# Patient Record
Sex: Female | Born: 1955
Health system: Southern US, Community
[De-identification: ages and names within clinical notes are randomized; demographics above are authoritative.]

## PROBLEM LIST (undated history)

## (undated) DIAGNOSIS — K219 Gastro-esophageal reflux disease without esophagitis: Secondary | ICD-10-CM

## (undated) DIAGNOSIS — C50919 Malignant neoplasm of unspecified site of unspecified female breast: Secondary | ICD-10-CM

## (undated) DIAGNOSIS — G5600 Carpal tunnel syndrome, unspecified upper limb: Secondary | ICD-10-CM

## (undated) HISTORY — PX: BREAST LUMPECTOMY: SHX2

## (undated) HISTORY — DX: Malignant neoplasm of unspecified site of unspecified female breast: C50.919

## (undated) HISTORY — DX: Gastro-esophageal reflux disease without esophagitis: K21.9

## (undated) HISTORY — DX: Carpal tunnel syndrome, unspecified upper limb: G56.00

---

## 2006-02-03 ENCOUNTER — Other Ambulatory Visit: Admission: RE | Admit: 2006-02-03 | Discharge: 2006-02-03 | Payer: Self-pay | Admitting: Obstetrics and Gynecology

## 2006-03-30 ENCOUNTER — Encounter: Admission: RE | Admit: 2006-03-30 | Discharge: 2006-03-30 | Payer: Self-pay | Admitting: Obstetrics and Gynecology

## 2006-03-30 ENCOUNTER — Encounter (INDEPENDENT_AMBULATORY_CARE_PROVIDER_SITE_OTHER): Payer: Self-pay | Admitting: Diagnostic Radiology

## 2006-03-30 ENCOUNTER — Encounter (INDEPENDENT_AMBULATORY_CARE_PROVIDER_SITE_OTHER): Payer: Self-pay | Admitting: *Deleted

## 2006-04-21 ENCOUNTER — Encounter: Admission: RE | Admit: 2006-04-21 | Discharge: 2006-04-21 | Payer: Self-pay | Admitting: General Surgery

## 2006-07-07 ENCOUNTER — Encounter: Admission: RE | Admit: 2006-07-07 | Discharge: 2006-07-07 | Payer: Self-pay | Admitting: General Surgery

## 2006-07-09 ENCOUNTER — Encounter: Admission: RE | Admit: 2006-07-09 | Discharge: 2006-07-09 | Payer: Self-pay | Admitting: General Surgery

## 2006-07-09 ENCOUNTER — Ambulatory Visit (HOSPITAL_BASED_OUTPATIENT_CLINIC_OR_DEPARTMENT_OTHER): Admission: RE | Admit: 2006-07-09 | Discharge: 2006-07-09 | Payer: Self-pay | Admitting: General Surgery

## 2006-07-09 ENCOUNTER — Encounter (INDEPENDENT_AMBULATORY_CARE_PROVIDER_SITE_OTHER): Payer: Self-pay | Admitting: Specialist

## 2006-07-10 ENCOUNTER — Ambulatory Visit: Payer: Self-pay | Admitting: Oncology

## 2006-07-15 LAB — CBC WITH DIFFERENTIAL/PLATELET
BASO%: 0.4 % (ref 0.0–2.0)
EOS%: 3 % (ref 0.0–7.0)
HCT: 33.8 % — ABNORMAL LOW (ref 34.8–46.6)
LYMPH%: 31.5 % (ref 14.0–48.0)
MCH: 23.9 pg — ABNORMAL LOW (ref 26.0–34.0)
MCHC: 32.2 g/dL (ref 32.0–36.0)
MONO%: 8.3 % (ref 0.0–13.0)
NEUT%: 56.8 % (ref 39.6–76.8)
Platelets: 238 10*3/uL (ref 145–400)
RBC: 4.55 10*6/uL (ref 3.70–5.32)
WBC: 3.6 10*3/uL — ABNORMAL LOW (ref 3.9–10.0)

## 2006-07-15 LAB — COMPREHENSIVE METABOLIC PANEL
ALT: 13 U/L (ref 0–40)
AST: 17 U/L (ref 0–37)
Alkaline Phosphatase: 100 U/L (ref 39–117)
CO2: 25 mEq/L (ref 19–32)
Creatinine, Ser: 0.93 mg/dL (ref 0.40–1.20)
Sodium: 139 mEq/L (ref 135–145)
Total Bilirubin: 0.4 mg/dL (ref 0.3–1.2)
Total Protein: 7.6 g/dL (ref 6.0–8.3)

## 2006-07-15 LAB — IRON AND TIBC
%SAT: 9 % — ABNORMAL LOW (ref 20–55)
TIBC: 455 ug/dL (ref 250–470)
UIBC: 416 ug/dL

## 2006-07-15 LAB — CANCER ANTIGEN 27.29: CA 27.29: 20 U/mL (ref 0–39)

## 2006-07-23 ENCOUNTER — Ambulatory Visit (HOSPITAL_COMMUNITY): Admission: RE | Admit: 2006-07-23 | Discharge: 2006-07-23 | Payer: Self-pay | Admitting: Oncology

## 2008-04-17 ENCOUNTER — Encounter: Payer: Self-pay | Admitting: Internal Medicine

## 2008-04-18 ENCOUNTER — Encounter: Admission: RE | Admit: 2008-04-18 | Discharge: 2008-04-18 | Payer: Self-pay | Admitting: Obstetrics and Gynecology

## 2008-05-01 ENCOUNTER — Ambulatory Visit: Payer: Self-pay | Admitting: Internal Medicine

## 2008-05-01 DIAGNOSIS — R195 Other fecal abnormalities: Secondary | ICD-10-CM | POA: Insufficient documentation

## 2008-05-01 DIAGNOSIS — K5900 Constipation, unspecified: Secondary | ICD-10-CM | POA: Insufficient documentation

## 2008-05-01 DIAGNOSIS — C50919 Malignant neoplasm of unspecified site of unspecified female breast: Secondary | ICD-10-CM

## 2008-05-10 ENCOUNTER — Encounter: Payer: Self-pay | Admitting: Internal Medicine

## 2008-05-10 ENCOUNTER — Ambulatory Visit: Payer: Self-pay | Admitting: Internal Medicine

## 2008-05-12 ENCOUNTER — Encounter: Payer: Self-pay | Admitting: Internal Medicine

## 2008-05-22 ENCOUNTER — Encounter (INDEPENDENT_AMBULATORY_CARE_PROVIDER_SITE_OTHER): Payer: Self-pay | Admitting: Surgery

## 2008-05-22 ENCOUNTER — Ambulatory Visit (HOSPITAL_BASED_OUTPATIENT_CLINIC_OR_DEPARTMENT_OTHER): Admission: RE | Admit: 2008-05-22 | Discharge: 2008-05-22 | Payer: Self-pay | Admitting: Surgery

## 2008-05-22 ENCOUNTER — Encounter: Admission: RE | Admit: 2008-05-22 | Discharge: 2008-05-22 | Payer: Self-pay | Admitting: Surgery

## 2008-05-31 ENCOUNTER — Ambulatory Visit: Admission: RE | Admit: 2008-05-31 | Discharge: 2008-08-29 | Payer: Self-pay | Admitting: Radiation Oncology

## 2009-03-15 ENCOUNTER — Ambulatory Visit: Payer: Self-pay | Admitting: Internal Medicine

## 2009-03-15 DIAGNOSIS — J018 Other acute sinusitis: Secondary | ICD-10-CM | POA: Insufficient documentation

## 2009-03-19 ENCOUNTER — Ambulatory Visit: Payer: Self-pay | Admitting: Hematology & Oncology

## 2009-03-22 ENCOUNTER — Ambulatory Visit: Payer: Self-pay | Admitting: Internal Medicine

## 2009-03-22 LAB — CONVERTED CEMR LAB
ALT: 26 units/L (ref 0–35)
Alkaline Phosphatase: 158 units/L — ABNORMAL HIGH (ref 39–117)
BUN: 11 mg/dL (ref 6–23)
Bilirubin, Direct: 0 mg/dL (ref 0.0–0.3)
Calcium: 9.8 mg/dL (ref 8.4–10.5)
Chloride: 102 meq/L (ref 96–112)
Cholesterol: 181 mg/dL (ref 0–200)
Creatinine, Ser: 0.8 mg/dL (ref 0.4–1.2)
Eosinophils Relative: 3.2 % (ref 0.0–5.0)
GFR calc non Af Amer: 96.51 mL/min (ref >60)
HDL: 79.7 mg/dL (ref >39.00)
LDL Cholesterol: 87 mg/dL (ref 0–99)
Lymphocytes Relative: 38 % (ref 12.0–46.0)
MCV: 87.2 fL (ref 78.0–100.0)
Monocytes Absolute: 0.6 10*3/uL (ref 0.1–1.0)
Neutrophils Relative %: 44.8 % (ref 43.0–77.0)
Platelets: 178 10*3/uL (ref 150.0–400.0)
Total Bilirubin: 0.8 mg/dL (ref 0.3–1.2)
Total CHOL/HDL Ratio: 2
Triglycerides: 73 mg/dL (ref 0.0–149.0)
VLDL: 14.6 mg/dL (ref 0.0–40.0)
WBC: 4.5 10*3/uL (ref 4.5–10.5)

## 2009-04-06 ENCOUNTER — Encounter: Payer: Self-pay | Admitting: Internal Medicine

## 2009-04-19 ENCOUNTER — Encounter: Admission: RE | Admit: 2009-04-19 | Discharge: 2009-04-19 | Payer: Self-pay | Admitting: Internal Medicine

## 2009-05-15 ENCOUNTER — Ambulatory Visit: Payer: Self-pay | Admitting: Hematology & Oncology

## 2009-05-16 ENCOUNTER — Encounter: Payer: Self-pay | Admitting: Internal Medicine

## 2009-05-16 LAB — CBC WITH DIFFERENTIAL (CANCER CENTER ONLY)
BASO%: 0.4 % (ref 0.0–2.0)
EOS%: 5.6 % (ref 0.0–7.0)
HCT: 38.1 % (ref 34.8–46.6)
LYMPH%: 42.7 % (ref 14.0–48.0)
MCH: 28.6 pg (ref 26.0–34.0)
MCHC: 33.9 g/dL (ref 32.0–36.0)
MCV: 84 fL (ref 81–101)
MONO#: 0.2 10*3/uL (ref 0.1–0.9)
MONO%: 5.8 % (ref 0.0–13.0)
NEUT%: 45.5 % (ref 39.6–80.0)
Platelets: 196 10*3/uL (ref 145–400)
RDW: 12 % (ref 10.5–14.6)
WBC: 3.1 10*3/uL — ABNORMAL LOW (ref 3.9–10.0)

## 2009-05-16 LAB — COMPREHENSIVE METABOLIC PANEL
ALT: 16 U/L (ref 0–35)
AST: 16 U/L (ref 0–37)
Albumin: 4.3 g/dL (ref 3.5–5.2)
Alkaline Phosphatase: 143 U/L — ABNORMAL HIGH (ref 39–117)
Calcium: 9.5 mg/dL (ref 8.4–10.5)
Chloride: 104 mEq/L (ref 96–112)
Potassium: 4 mEq/L (ref 3.5–5.3)
Sodium: 141 mEq/L (ref 135–145)

## 2009-09-13 ENCOUNTER — Ambulatory Visit (HOSPITAL_BASED_OUTPATIENT_CLINIC_OR_DEPARTMENT_OTHER): Admission: RE | Admit: 2009-09-13 | Discharge: 2009-09-13 | Payer: Self-pay | Admitting: Internal Medicine

## 2009-09-13 ENCOUNTER — Telehealth: Payer: Self-pay | Admitting: Internal Medicine

## 2009-09-13 ENCOUNTER — Ambulatory Visit: Payer: Self-pay | Admitting: Diagnostic Radiology

## 2009-09-13 ENCOUNTER — Ambulatory Visit: Payer: Self-pay | Admitting: Internal Medicine

## 2009-09-13 DIAGNOSIS — R079 Chest pain, unspecified: Secondary | ICD-10-CM

## 2009-09-13 DIAGNOSIS — G5600 Carpal tunnel syndrome, unspecified upper limb: Secondary | ICD-10-CM | POA: Insufficient documentation

## 2009-09-20 ENCOUNTER — Telehealth: Payer: Self-pay | Admitting: Internal Medicine

## 2009-10-02 ENCOUNTER — Telehealth: Payer: Self-pay | Admitting: Internal Medicine

## 2009-10-02 ENCOUNTER — Ambulatory Visit (HOSPITAL_COMMUNITY): Admission: RE | Admit: 2009-10-02 | Discharge: 2009-10-02 | Payer: Self-pay | Admitting: Internal Medicine

## 2009-11-06 ENCOUNTER — Ambulatory Visit: Payer: Self-pay | Admitting: Hematology & Oncology

## 2009-11-07 ENCOUNTER — Encounter: Payer: Self-pay | Admitting: Internal Medicine

## 2009-11-07 LAB — CANCER ANTIGEN 27.29: CA 27.29: 12 U/mL (ref 0–39)

## 2009-11-07 LAB — CBC WITH DIFFERENTIAL (CANCER CENTER ONLY)
Eosinophils Absolute: 0.2 10*3/uL (ref 0.0–0.5)
HCT: 38 % (ref 34.8–46.6)
LYMPH%: 38.8 % (ref 14.0–48.0)
MCH: 28.6 pg (ref 26.0–34.0)
MCV: 83 fL (ref 81–101)
MONO#: 0.2 10*3/uL (ref 0.1–0.9)
MONO%: 5.5 % (ref 0.0–13.0)
NEUT%: 49.7 % (ref 39.6–80.0)
Platelets: 178 10*3/uL (ref 145–400)
RBC: 4.59 10*6/uL (ref 3.70–5.32)

## 2009-11-08 LAB — VITAMIN D 25 HYDROXY (VIT D DEFICIENCY, FRACTURES): Vit D, 25-Hydroxy: 26 ng/mL — ABNORMAL LOW (ref 30–89)

## 2009-11-08 LAB — COMPREHENSIVE METABOLIC PANEL
ALT: 25 U/L (ref 0–35)
BUN: 12 mg/dL (ref 6–23)
CO2: 22 mEq/L (ref 19–32)
Calcium: 9.7 mg/dL (ref 8.4–10.5)
Chloride: 103 mEq/L (ref 96–112)
Creatinine, Ser: 0.9 mg/dL (ref 0.40–1.20)
Glucose, Bld: 87 mg/dL (ref 70–99)
Total Bilirubin: 0.5 mg/dL (ref 0.3–1.2)

## 2009-11-22 ENCOUNTER — Ambulatory Visit: Payer: Self-pay | Admitting: Diagnostic Radiology

## 2009-11-22 ENCOUNTER — Ambulatory Visit (HOSPITAL_BASED_OUTPATIENT_CLINIC_OR_DEPARTMENT_OTHER): Admission: RE | Admit: 2009-11-22 | Discharge: 2009-11-22 | Payer: Self-pay | Admitting: Hematology & Oncology

## 2009-12-05 ENCOUNTER — Encounter: Admission: RE | Admit: 2009-12-05 | Discharge: 2009-12-05 | Payer: Self-pay | Admitting: Internal Medicine

## 2009-12-20 ENCOUNTER — Ambulatory Visit: Payer: Self-pay | Admitting: Hematology & Oncology

## 2009-12-24 ENCOUNTER — Encounter: Payer: Self-pay | Admitting: Internal Medicine

## 2009-12-24 LAB — CBC WITH DIFFERENTIAL (CANCER CENTER ONLY)
Eosinophils Absolute: 0.1 10*3/uL (ref 0.0–0.5)
HCT: 38.6 % (ref 34.8–46.6)
LYMPH%: 42.4 % (ref 14.0–48.0)
MCV: 85 fL (ref 81–101)
MONO#: 0.2 10*3/uL (ref 0.1–0.9)
NEUT%: 46.4 % (ref 39.6–80.0)
RBC: 4.57 10*6/uL (ref 3.70–5.32)
RDW: 12 % (ref 10.5–14.6)
WBC: 3.6 10*3/uL — ABNORMAL LOW (ref 3.9–10.0)

## 2009-12-25 LAB — COMPREHENSIVE METABOLIC PANEL
CO2: 21 mEq/L (ref 19–32)
Creatinine, Ser: 0.92 mg/dL (ref 0.40–1.20)
Glucose, Bld: 101 mg/dL — ABNORMAL HIGH (ref 70–99)
Total Bilirubin: 0.3 mg/dL (ref 0.3–1.2)

## 2009-12-25 LAB — VITAMIN D 25 HYDROXY (VIT D DEFICIENCY, FRACTURES): Vit D, 25-Hydroxy: 23 ng/mL — ABNORMAL LOW (ref 30–89)

## 2009-12-26 ENCOUNTER — Ambulatory Visit (HOSPITAL_BASED_OUTPATIENT_CLINIC_OR_DEPARTMENT_OTHER): Admission: RE | Admit: 2009-12-26 | Discharge: 2009-12-26 | Payer: Self-pay | Admitting: Hematology & Oncology

## 2009-12-26 ENCOUNTER — Ambulatory Visit: Payer: Self-pay | Admitting: Diagnostic Radiology

## 2010-01-29 ENCOUNTER — Ambulatory Visit: Payer: Self-pay | Admitting: Hematology & Oncology

## 2010-01-30 ENCOUNTER — Encounter: Payer: Self-pay | Admitting: Internal Medicine

## 2010-01-30 LAB — COMPREHENSIVE METABOLIC PANEL
ALT: 27 U/L (ref 0–35)
BUN: 13 mg/dL (ref 6–23)
CO2: 21 mEq/L (ref 19–32)
Calcium: 9.3 mg/dL (ref 8.4–10.5)
Chloride: 104 mEq/L (ref 96–112)
Creatinine, Ser: 0.86 mg/dL (ref 0.40–1.20)
Glucose, Bld: 83 mg/dL (ref 70–99)
Total Bilirubin: 0.3 mg/dL (ref 0.3–1.2)

## 2010-01-30 LAB — CBC WITH DIFFERENTIAL (CANCER CENTER ONLY)
BASO#: 0 10*3/uL (ref 0.0–0.2)
BASO%: 0.5 % (ref 0.0–2.0)
EOS%: 3.2 % (ref 0.0–7.0)
HCT: 39.7 % (ref 34.8–46.6)
HGB: 13.1 g/dL (ref 11.6–15.9)
LYMPH#: 1.9 10*3/uL (ref 0.9–3.3)
MCHC: 33 g/dL (ref 32.0–36.0)
MONO#: 0.4 10*3/uL (ref 0.1–0.9)
NEUT#: 2.7 10*3/uL (ref 1.5–6.5)
WBC: 5.1 10*3/uL (ref 3.9–10.0)

## 2010-01-30 LAB — VITAMIN D 25 HYDROXY (VIT D DEFICIENCY, FRACTURES): Vit D, 25-Hydroxy: 28 ng/mL — ABNORMAL LOW (ref 30–89)

## 2010-01-30 LAB — CANCER ANTIGEN 27.29: CA 27.29: 4 U/mL (ref 0–39)

## 2010-02-27 LAB — COMPREHENSIVE METABOLIC PANEL
ALT: 29 U/L (ref 0–35)
AST: 28 U/L (ref 0–37)
Albumin: 4.4 g/dL (ref 3.5–5.2)
Alkaline Phosphatase: 113 U/L (ref 39–117)
Glucose, Bld: 107 mg/dL — ABNORMAL HIGH (ref 70–99)
Potassium: 4.2 mEq/L (ref 3.5–5.3)
Sodium: 139 mEq/L (ref 135–145)
Total Bilirubin: 0.4 mg/dL (ref 0.3–1.2)
Total Protein: 7.5 g/dL (ref 6.0–8.3)

## 2010-03-27 ENCOUNTER — Ambulatory Visit: Payer: Self-pay | Admitting: Hematology & Oncology

## 2010-03-29 LAB — COMPREHENSIVE METABOLIC PANEL
Albumin: 4.3 g/dL (ref 3.5–5.2)
Alkaline Phosphatase: 104 U/L (ref 39–117)
BUN: 12 mg/dL (ref 6–23)
CO2: 21 mEq/L (ref 19–32)
Calcium: 9.3 mg/dL (ref 8.4–10.5)
Glucose, Bld: 101 mg/dL — ABNORMAL HIGH (ref 70–99)
Potassium: 4 mEq/L (ref 3.5–5.3)
Sodium: 139 mEq/L (ref 135–145)
Total Protein: 7.5 g/dL (ref 6.0–8.3)

## 2010-03-29 LAB — CANCER ANTIGEN 27.29: CA 27.29: 15 U/mL (ref 0–39)

## 2010-04-18 ENCOUNTER — Encounter: Payer: Self-pay | Admitting: Internal Medicine

## 2010-04-30 ENCOUNTER — Ambulatory Visit (HOSPITAL_COMMUNITY): Admission: RE | Admit: 2010-04-30 | Discharge: 2010-04-30 | Payer: Self-pay | Admitting: Hematology & Oncology

## 2010-05-21 ENCOUNTER — Ambulatory Visit: Payer: Self-pay | Admitting: Hematology & Oncology

## 2010-05-23 ENCOUNTER — Encounter: Payer: Self-pay | Admitting: Internal Medicine

## 2010-05-23 LAB — CBC WITH DIFFERENTIAL (CANCER CENTER ONLY)
BASO%: 0.5 % (ref 0.0–2.0)
HCT: 36.8 % (ref 34.8–46.6)
LYMPH%: 36.4 % (ref 14.0–48.0)
MCH: 28.7 pg (ref 26.0–34.0)
MCV: 84 fL (ref 81–101)
MONO%: 5.7 % (ref 0.0–13.0)
NEUT%: 51.9 % (ref 39.6–80.0)
Platelets: 182 10*3/uL (ref 145–400)
RDW: 12.3 % (ref 10.5–14.6)

## 2010-05-23 LAB — COMPREHENSIVE METABOLIC PANEL
ALT: 19 U/L (ref 0–35)
Alkaline Phosphatase: 106 U/L (ref 39–117)
Creatinine, Ser: 0.9 mg/dL (ref 0.40–1.20)
Glucose, Bld: 104 mg/dL — ABNORMAL HIGH (ref 70–99)
Sodium: 138 mEq/L (ref 135–145)
Total Bilirubin: 0.4 mg/dL (ref 0.3–1.2)
Total Protein: 7.5 g/dL (ref 6.0–8.3)

## 2010-06-26 ENCOUNTER — Ambulatory Visit: Payer: Self-pay | Admitting: Hematology & Oncology

## 2010-06-27 LAB — BASIC METABOLIC PANEL
BUN: 13 mg/dL (ref 6–23)
CO2: 22 mEq/L (ref 19–32)
Chloride: 101 mEq/L (ref 96–112)
Glucose, Bld: 115 mg/dL — ABNORMAL HIGH (ref 70–99)
Potassium: 4 mEq/L (ref 3.5–5.3)
Sodium: 139 mEq/L (ref 135–145)

## 2010-07-25 ENCOUNTER — Encounter: Payer: Self-pay | Admitting: Internal Medicine

## 2010-07-25 LAB — CBC WITH DIFFERENTIAL (CANCER CENTER ONLY)
BASO%: 0.8 % (ref 0.0–2.0)
EOS%: 4.2 % (ref 0.0–7.0)
HCT: 38.2 % (ref 34.8–46.6)
LYMPH#: 2 10*3/uL (ref 0.9–3.3)
LYMPH%: 35.9 % (ref 14.0–48.0)
MCH: 28.3 pg (ref 26.0–34.0)
MCHC: 33.3 g/dL (ref 32.0–36.0)
MONO%: 6.5 % (ref 0.0–13.0)
NEUT%: 52.6 % (ref 39.6–80.0)
RDW: 12.3 % (ref 10.5–14.6)

## 2010-07-25 LAB — COMPREHENSIVE METABOLIC PANEL
ALT: 87 U/L — ABNORMAL HIGH (ref 0–35)
AST: 58 U/L — ABNORMAL HIGH (ref 0–37)
Alkaline Phosphatase: 142 U/L — ABNORMAL HIGH (ref 39–117)
Chloride: 106 mEq/L (ref 96–112)
Creatinine, Ser: 0.81 mg/dL (ref 0.40–1.20)
Total Bilirubin: 0.4 mg/dL (ref 0.3–1.2)

## 2010-07-31 ENCOUNTER — Encounter: Admission: RE | Admit: 2010-07-31 | Discharge: 2010-07-31 | Payer: Self-pay | Admitting: Hematology & Oncology

## 2010-08-05 ENCOUNTER — Ambulatory Visit (HOSPITAL_COMMUNITY): Admission: RE | Admit: 2010-08-05 | Discharge: 2010-08-05 | Payer: Self-pay | Admitting: Hematology & Oncology

## 2010-08-20 ENCOUNTER — Ambulatory Visit: Payer: Self-pay | Admitting: Hematology & Oncology

## 2010-08-22 ENCOUNTER — Ambulatory Visit: Payer: Self-pay | Admitting: Oncology

## 2010-08-22 LAB — BASIC METABOLIC PANEL
BUN: 8 mg/dL (ref 6–23)
Chloride: 107 mEq/L (ref 96–112)
Glucose, Bld: 119 mg/dL — ABNORMAL HIGH (ref 70–99)
Potassium: 3.9 mEq/L (ref 3.5–5.3)

## 2010-09-19 ENCOUNTER — Ambulatory Visit: Payer: Self-pay | Admitting: Hematology & Oncology

## 2010-09-20 ENCOUNTER — Encounter: Payer: Self-pay | Admitting: Internal Medicine

## 2010-09-20 LAB — BASIC METABOLIC PANEL
BUN: 10 mg/dL (ref 6–23)
Glucose, Bld: 85 mg/dL (ref 70–99)
Potassium: 4.1 mEq/L (ref 3.5–5.3)

## 2010-09-20 LAB — CANCER ANTIGEN 27.29: CA 27.29: 15 U/mL (ref 0–39)

## 2010-10-18 LAB — VITAMIN D 25 HYDROXY (VIT D DEFICIENCY, FRACTURES): Vit D, 25-Hydroxy: 23 ng/mL — ABNORMAL LOW (ref 30–89)

## 2010-10-18 LAB — BASIC METABOLIC PANEL
Chloride: 101 mEq/L (ref 96–112)
Potassium: 3.8 mEq/L (ref 3.5–5.3)

## 2010-11-12 ENCOUNTER — Ambulatory Visit: Payer: Self-pay | Admitting: Hematology & Oncology

## 2010-11-13 LAB — BASIC METABOLIC PANEL
BUN: 8 mg/dL (ref 6–23)
CO2: 24 mEq/L (ref 19–32)
Glucose, Bld: 92 mg/dL (ref 70–99)
Potassium: 3.7 mEq/L (ref 3.5–5.3)
Sodium: 141 mEq/L (ref 135–145)

## 2010-12-12 ENCOUNTER — Ambulatory Visit: Payer: Self-pay | Admitting: Hematology & Oncology

## 2010-12-12 ENCOUNTER — Encounter: Payer: Self-pay | Admitting: Internal Medicine

## 2010-12-12 LAB — BASIC METABOLIC PANEL
Chloride: 104 mEq/L (ref 96–112)
Glucose, Bld: 83 mg/dL (ref 70–99)
Potassium: 3.8 mEq/L (ref 3.5–5.3)
Sodium: 139 mEq/L (ref 135–145)

## 2011-01-12 ENCOUNTER — Encounter: Payer: Self-pay | Admitting: Obstetrics and Gynecology

## 2011-01-12 ENCOUNTER — Encounter: Payer: Self-pay | Admitting: Internal Medicine

## 2011-01-12 ENCOUNTER — Encounter: Payer: Self-pay | Admitting: General Surgery

## 2011-01-13 ENCOUNTER — Encounter: Payer: Self-pay | Admitting: Radiation Oncology

## 2011-01-21 ENCOUNTER — Ambulatory Visit: Payer: Self-pay | Admitting: Oncology

## 2011-01-21 NOTE — Letter (Signed)
[facility]   Imported By: Lanelle Bal 01/21/2010 13:07:37  _____________________________________________________________________  External Attachment:    Type:   Image     Comment:   External Document

## 2011-01-21 NOTE — Letter (Signed)
Summary: [facility]  [facility]   Imported By: Maryln Gottron 10/18/2010 13:31:56  _____________________________________________________________________  External Attachment:    Type:   Image     Comment:   External Document

## 2011-01-21 NOTE — Letter (Signed)
[facility]   Imported By: Lanelle Bal 06/27/2010 12:08:54  _____________________________________________________________________  External Attachment:    Type:   Image     Comment:   External Document

## 2011-01-21 NOTE — Letter (Signed)
[facility]   Imported By: Lanelle Bal 05/13/2010 11:13:23  _____________________________________________________________________  External Attachment:    Type:   Image     Comment:   External Document

## 2011-01-21 NOTE — Letter (Signed)
[facility]   Imported By: Lanelle Bal 02/19/2010 11:59:38  _____________________________________________________________________  External Attachment:    Type:   Image     Comment:   External Document

## 2011-01-21 NOTE — Letter (Signed)
[facility]   Imported By: Lanelle Bal 08/14/2010 09:32:02  _____________________________________________________________________  External Attachment:    Type:   Image     Comment:   External Document

## 2011-01-23 NOTE — Letter (Signed)
Summary: [facility]  [facility]   Imported By: Lanelle Bal 01/07/2011 09:40:22  _____________________________________________________________________  External Attachment:    Type:   Image     Comment:   External Document

## 2011-03-21 ENCOUNTER — Other Ambulatory Visit: Payer: Self-pay | Admitting: Hematology & Oncology

## 2011-03-21 ENCOUNTER — Encounter (HOSPITAL_BASED_OUTPATIENT_CLINIC_OR_DEPARTMENT_OTHER): Payer: Managed Care, Other (non HMO) | Admitting: Hematology & Oncology

## 2011-03-21 DIAGNOSIS — M549 Dorsalgia, unspecified: Secondary | ICD-10-CM

## 2011-03-21 DIAGNOSIS — C50519 Malignant neoplasm of lower-outer quadrant of unspecified female breast: Secondary | ICD-10-CM

## 2011-03-21 DIAGNOSIS — C7952 Secondary malignant neoplasm of bone marrow: Secondary | ICD-10-CM

## 2011-03-21 DIAGNOSIS — C50919 Malignant neoplasm of unspecified site of unspecified female breast: Secondary | ICD-10-CM

## 2011-03-21 DIAGNOSIS — C7951 Secondary malignant neoplasm of bone: Secondary | ICD-10-CM

## 2011-03-21 LAB — CBC WITH DIFFERENTIAL (CANCER CENTER ONLY)
BASO#: 0 10*3/uL (ref 0.0–0.2)
EOS%: 4.3 % (ref 0.0–7.0)
HCT: 36.3 % (ref 34.8–46.6)
HGB: 12.3 g/dL (ref 11.6–15.9)
LYMPH%: 39.2 % (ref 14.0–48.0)
MCH: 28.3 pg (ref 26.0–34.0)
MCHC: 33.9 g/dL (ref 32.0–36.0)
MCV: 83 fL (ref 81–101)
MONO%: 8.1 % (ref 0.0–13.0)
NEUT%: 48.4 % (ref 39.6–80.0)

## 2011-03-21 LAB — CMP (CANCER CENTER ONLY)
AST: 22 U/L (ref 11–38)
Alkaline Phosphatase: 109 U/L — ABNORMAL HIGH (ref 26–84)
BUN, Bld: 11 mg/dL (ref 7–22)
Calcium: 8.9 mg/dL (ref 8.0–10.3)
Chloride: 100 mEq/L (ref 98–108)
Creat: 0.8 mg/dl (ref 0.6–1.2)
Total Bilirubin: 0.7 mg/dl (ref 0.20–1.60)

## 2011-03-21 LAB — CANCER ANTIGEN 27.29: CA 27.29: 13 U/mL (ref 0–39)

## 2011-04-09 ENCOUNTER — Encounter (HOSPITAL_COMMUNITY)
Admission: RE | Admit: 2011-04-09 | Discharge: 2011-04-09 | Disposition: A | Discharge status: Home / Self Care | Payer: Managed Care, Other (non HMO) | Source: Ambulatory Visit | Attending: Hematology & Oncology | Admitting: Hematology & Oncology

## 2011-04-09 ENCOUNTER — Encounter (HOSPITAL_COMMUNITY): Payer: Self-pay

## 2011-04-09 DIAGNOSIS — C50919 Malignant neoplasm of unspecified site of unspecified female breast: Secondary | ICD-10-CM | POA: Insufficient documentation

## 2011-04-09 DIAGNOSIS — C7951 Secondary malignant neoplasm of bone: Secondary | ICD-10-CM | POA: Insufficient documentation

## 2011-04-09 LAB — GLUCOSE, CAPILLARY: Glucose-Capillary: 100 mg/dL — ABNORMAL HIGH (ref 70–99)

## 2011-04-09 MED ORDER — FLUDEOXYGLUCOSE F - 18 (FDG) INJECTION
17.2000 | Freq: Once | INTRAVENOUS | Status: AC | PRN
  Administered 2011-04-09: 17.2 via INTRAVENOUS

## 2011-04-21 ENCOUNTER — Other Ambulatory Visit: Payer: Self-pay | Admitting: Hematology & Oncology

## 2011-04-21 ENCOUNTER — Encounter (HOSPITAL_BASED_OUTPATIENT_CLINIC_OR_DEPARTMENT_OTHER): Payer: Managed Care, Other (non HMO) | Admitting: Hematology & Oncology

## 2011-04-21 DIAGNOSIS — C50519 Malignant neoplasm of lower-outer quadrant of unspecified female breast: Secondary | ICD-10-CM

## 2011-04-21 DIAGNOSIS — C7951 Secondary malignant neoplasm of bone: Secondary | ICD-10-CM

## 2011-04-21 LAB — CBC WITH DIFFERENTIAL (CANCER CENTER ONLY)
BASO%: 0.2 % (ref 0.0–2.0)
Eosinophils Absolute: 0.2 10*3/uL (ref 0.0–0.5)
LYMPH#: 1.8 10*3/uL (ref 0.9–3.3)
LYMPH%: 37.8 % (ref 14.0–48.0)
MCV: 84 fL (ref 81–101)
MONO#: 0.4 10*3/uL (ref 0.1–0.9)
NEUT#: 2.3 10*3/uL (ref 1.5–6.5)
Platelets: 166 10*3/uL (ref 145–400)
RBC: 4.57 10*6/uL (ref 3.70–5.32)
RDW: 12.4 % (ref 11.1–15.7)
WBC: 4.7 10*3/uL (ref 3.9–10.0)

## 2011-04-21 LAB — CMP (CANCER CENTER ONLY)
ALT(SGPT): 27 U/L (ref 10–47)
Albumin: 3.5 g/dL (ref 3.3–5.5)
CO2: 22 mEq/L (ref 18–33)
Calcium: 9.1 mg/dL (ref 8.0–10.3)
Chloride: 109 mEq/L — ABNORMAL HIGH (ref 98–108)
Glucose, Bld: 134 mg/dL — ABNORMAL HIGH (ref 73–118)
Potassium: 4.1 mEq/L (ref 3.3–4.7)
Sodium: 143 mEq/L (ref 128–145)
Total Protein: 7.8 g/dL (ref 6.4–8.1)

## 2011-04-22 LAB — VITAMIN D 25 HYDROXY (VIT D DEFICIENCY, FRACTURES): Vit D, 25-Hydroxy: 19 ng/mL — ABNORMAL LOW (ref 30–89)

## 2011-05-06 NOTE — Op Note (Signed)
Nancy Herring, Nancy Herring             ACCOUNT NO.:  [id number]   MEDICAL RECORD NO.:  [id number]          PATIENT TYPE:  AMB   LOCATION:  DSC                          FACILITY:  [facility]   PHYSICIAN:  Thomas A. Cornett, M.D.DATE OF BIRTH:  July 14, 1956   DATE OF PROCEDURE:  05/22/2008  DATE OF DISCHARGE:                               OPERATIVE REPORT   PREOPERATIVE DIAGNOSIS:  Left breast microcalcifications.   POSTOPERATIVE DIAGNOSIS:  Left breast microcalcifications.   PROCEDURE:  Left breast needle-localized excisional biopsy.   SURGEON:  Maisie Fus A. Cornett, MD   ASSISTANT:  OR staff.   ANESTHESIA:  LMA with 0.25% Sensorcaine local.   ESTIMATED BLOOD LOSS:  10 mL.   SPECIMEN:  Left breast tissue with localizing wire.  Microcalcifications  were noted on the radiograph.   INDICATIONS FOR PROCEDURE:  The patient is a 55 year old female with  calcifications in the deep left breast.  These were deemed suspicious,  but not accessible by stereotactic core techniques.  She presents today  for left breast needle-localized lumpectomy.   DESCRIPTION OF PROCEDURE:  The patient was brought to the operating room  after undergoing left breast wire localization.  After induction of LMA  anesthesia, the left breast was prepped and draped in sterile fashion.  A 0.25% Sensorcaine was infiltrated around the area.  An old scar was  used from a previous breast surgery.  We dissected down around the tip  of the wire and excised all tissue.  Radiograph was obtained in the  operating room which showed calcifications in the wire that was intact.  This was sent to pathology for further evaluation.  We then irrigated  out the wound and closed it using 4-0 Monocryl.  Dermabond was applied.  All final counts of sponge, needle, and instruments were found to be  correct at this portion of the case.  The patient was then awoke taken  to recovery in satisfactory condition.  All final counts of sponge,  needle,  and instruments were found to be correct at this portion of the  case.      Thomas A. Cornett, M.D.  Electronically Signed    TAC/MEDQ  D:  05/22/2008  T:  05/23/2008  Job:  [id number]   cc:   Guy Sandifer. Henderson Cloud, M.D.

## 2011-06-04 ENCOUNTER — Encounter (HOSPITAL_BASED_OUTPATIENT_CLINIC_OR_DEPARTMENT_OTHER): Payer: Managed Care, Other (non HMO) | Admitting: Hematology & Oncology

## 2011-06-04 ENCOUNTER — Other Ambulatory Visit: Payer: Self-pay | Admitting: Hematology & Oncology

## 2011-06-04 DIAGNOSIS — C50519 Malignant neoplasm of lower-outer quadrant of unspecified female breast: Secondary | ICD-10-CM

## 2011-06-04 DIAGNOSIS — C7952 Secondary malignant neoplasm of bone marrow: Secondary | ICD-10-CM

## 2011-06-04 LAB — COMPREHENSIVE METABOLIC PANEL
BUN: 13 mg/dL (ref 6–23)
CO2: 24 mEq/L (ref 19–32)
Calcium: 9.6 mg/dL (ref 8.4–10.5)
Chloride: 105 mEq/L (ref 96–112)
Creatinine, Ser: 0.91 mg/dL (ref 0.50–1.10)
Total Bilirubin: 0.3 mg/dL (ref 0.3–1.2)

## 2011-06-04 LAB — CBC WITH DIFFERENTIAL (CANCER CENTER ONLY)
EOS%: 3.6 % (ref 0.0–7.0)
HGB: 13 g/dL (ref 11.6–15.9)
LYMPH#: 1.7 10*3/uL (ref 0.9–3.3)
MCH: 27.7 pg (ref 26.0–34.0)
MCHC: 33.5 g/dL (ref 32.0–36.0)
MONO%: 10.4 % (ref 0.0–13.0)
NEUT#: 2.2 10*3/uL (ref 1.5–6.5)
Platelets: 175 10*3/uL (ref 145–400)
RBC: 4.7 10*6/uL (ref 3.70–5.32)

## 2011-06-04 LAB — TECHNOLOGIST REVIEW CHCC SATELLITE

## 2011-06-04 LAB — LACTATE DEHYDROGENASE: LDH: 189 U/L (ref 94–250)

## 2011-07-16 ENCOUNTER — Encounter (HOSPITAL_BASED_OUTPATIENT_CLINIC_OR_DEPARTMENT_OTHER): Payer: Managed Care, Other (non HMO) | Admitting: Hematology & Oncology

## 2011-07-16 ENCOUNTER — Other Ambulatory Visit: Payer: Self-pay | Admitting: Family

## 2011-07-16 DIAGNOSIS — C7951 Secondary malignant neoplasm of bone: Secondary | ICD-10-CM

## 2011-07-16 DIAGNOSIS — C50519 Malignant neoplasm of lower-outer quadrant of unspecified female breast: Secondary | ICD-10-CM

## 2011-07-16 LAB — COMPREHENSIVE METABOLIC PANEL
ALT: 15 U/L (ref 0–35)
AST: 18 U/L (ref 0–37)
Alkaline Phosphatase: 103 U/L (ref 39–117)
Calcium: 9.6 mg/dL (ref 8.4–10.5)
Chloride: 106 mEq/L (ref 96–112)
Creatinine, Ser: 0.87 mg/dL (ref 0.50–1.10)

## 2011-08-21 ENCOUNTER — Other Ambulatory Visit: Payer: Self-pay | Admitting: Hematology & Oncology

## 2011-08-21 DIAGNOSIS — Z9889 Other specified postprocedural states: Secondary | ICD-10-CM

## 2011-08-21 DIAGNOSIS — Z853 Personal history of malignant neoplasm of breast: Secondary | ICD-10-CM

## 2011-08-27 ENCOUNTER — Ambulatory Visit
Admission: RE | Admit: 2011-08-27 | Discharge: 2011-08-27 | Disposition: A | Discharge status: Home / Self Care | Payer: Managed Care, Other (non HMO) | Source: Ambulatory Visit | Attending: Hematology & Oncology | Admitting: Hematology & Oncology

## 2011-08-27 DIAGNOSIS — Z853 Personal history of malignant neoplasm of breast: Secondary | ICD-10-CM

## 2011-08-27 DIAGNOSIS — Z9889 Other specified postprocedural states: Secondary | ICD-10-CM

## 2011-09-17 LAB — CBC
HCT: 35.3 — ABNORMAL LOW
Hemoglobin: 11.6 — ABNORMAL LOW
MCV: 81.2
RBC: 4.35
WBC: 4.7

## 2011-09-17 LAB — BASIC METABOLIC PANEL
BUN: 5 — ABNORMAL LOW
Calcium: 9.3
Creatinine, Ser: 0.83
GFR calc Af Amer: >60

## 2011-09-17 LAB — DIFFERENTIAL
Eosinophils Absolute: 0.2
Eosinophils Relative: 5
Lymphocytes Relative: 33
Lymphs Abs: 1.5
Monocytes Relative: 10

## 2011-10-02 ENCOUNTER — Encounter (HOSPITAL_BASED_OUTPATIENT_CLINIC_OR_DEPARTMENT_OTHER): Payer: Managed Care, Other (non HMO) | Admitting: Hematology & Oncology

## 2011-10-02 ENCOUNTER — Other Ambulatory Visit: Payer: Self-pay | Admitting: Hematology & Oncology

## 2011-10-02 DIAGNOSIS — C50519 Malignant neoplasm of lower-outer quadrant of unspecified female breast: Secondary | ICD-10-CM

## 2011-10-02 DIAGNOSIS — C7951 Secondary malignant neoplasm of bone: Secondary | ICD-10-CM

## 2011-10-02 DIAGNOSIS — C7952 Secondary malignant neoplasm of bone marrow: Secondary | ICD-10-CM

## 2011-10-02 DIAGNOSIS — E559 Vitamin D deficiency, unspecified: Secondary | ICD-10-CM

## 2011-10-02 LAB — CMP (CANCER CENTER ONLY)
Alkaline Phosphatase: 114 U/L — ABNORMAL HIGH (ref 26–84)
CO2: 28 mEq/L (ref 18–33)
Creat: 0.9 mg/dl (ref 0.6–1.2)
Glucose, Bld: 105 mg/dL (ref 73–118)
Sodium: 141 mEq/L (ref 128–145)
Total Bilirubin: 0.6 mg/dl (ref 0.20–1.60)

## 2011-10-02 LAB — CBC WITH DIFFERENTIAL (CANCER CENTER ONLY)
Eosinophils Absolute: 0.2 10*3/uL (ref 0.0–0.5)
HCT: 38.9 % (ref 34.8–46.6)
LYMPH%: 35.4 % (ref 14.0–48.0)
MCH: 28.6 pg (ref 26.0–34.0)
MCV: 84 fL (ref 81–101)
MONO#: 0.5 10*3/uL (ref 0.1–0.9)
MONO%: 10.2 % (ref 0.0–13.0)
NEUT%: 50.2 % (ref 39.6–80.0)
Platelets: 168 10*3/uL (ref 145–400)
RDW: 12.5 % (ref 11.1–15.7)
WBC: 4.8 10*3/uL (ref 3.9–10.0)

## 2011-10-03 LAB — LACTATE DEHYDROGENASE: LDH: 172 U/L (ref 94–250)

## 2011-10-03 LAB — VITAMIN D 25 HYDROXY (VIT D DEFICIENCY, FRACTURES): Vit D, 25-Hydroxy: 37 ng/mL (ref 30–89)

## 2011-10-03 LAB — CANCER ANTIGEN 27.29: CA 27.29: 15 U/mL (ref 0–39)

## 2012-01-15 ENCOUNTER — Other Ambulatory Visit (HOSPITAL_BASED_OUTPATIENT_CLINIC_OR_DEPARTMENT_OTHER): Payer: Managed Care, Other (non HMO) | Admitting: Lab

## 2012-01-15 ENCOUNTER — Ambulatory Visit (HOSPITAL_BASED_OUTPATIENT_CLINIC_OR_DEPARTMENT_OTHER): Payer: Managed Care, Other (non HMO)

## 2012-01-15 ENCOUNTER — Ambulatory Visit (HOSPITAL_BASED_OUTPATIENT_CLINIC_OR_DEPARTMENT_OTHER): Payer: Managed Care, Other (non HMO) | Admitting: Hematology & Oncology

## 2012-01-15 DIAGNOSIS — C50519 Malignant neoplasm of lower-outer quadrant of unspecified female breast: Secondary | ICD-10-CM

## 2012-01-15 DIAGNOSIS — C7952 Secondary malignant neoplasm of bone marrow: Secondary | ICD-10-CM

## 2012-01-15 DIAGNOSIS — C50919 Malignant neoplasm of unspecified site of unspecified female breast: Secondary | ICD-10-CM

## 2012-01-15 DIAGNOSIS — C7951 Secondary malignant neoplasm of bone: Secondary | ICD-10-CM

## 2012-01-15 LAB — CBC WITH DIFFERENTIAL (CANCER CENTER ONLY)
BASO#: 0 10*3/uL (ref 0.0–0.2)
Eosinophils Absolute: 0.2 10*3/uL (ref 0.0–0.5)
HGB: 13.5 g/dL (ref 11.6–15.9)
LYMPH#: 2 10*3/uL (ref 0.9–3.3)
MONO#: 0.4 10*3/uL (ref 0.1–0.9)
MONO%: 8.8 % (ref 0.0–13.0)
NEUT#: 2.1 10*3/uL (ref 1.5–6.5)
Platelets: 160 10*3/uL (ref 145–400)
RBC: 4.78 10*6/uL (ref 3.70–5.32)
WBC: 4.6 10*3/uL (ref 3.9–10.0)

## 2012-01-15 MED ORDER — SODIUM CHLORIDE 0.9 % IV SOLN
Freq: Once | INTRAVENOUS | Status: AC
  Administered 2012-01-15: 11:00:00 via INTRAVENOUS

## 2012-01-15 MED ORDER — ZOLEDRONIC ACID 4 MG/100ML IV SOLN
4.0000 mg | Freq: Once | INTRAVENOUS | Status: AC
  Administered 2012-01-15: 4 mg via INTRAVENOUS
  Filled 2012-01-15: qty 100

## 2012-01-15 NOTE — Progress Notes (Signed)
This office note has been dictated.

## 2012-01-16 LAB — COMPREHENSIVE METABOLIC PANEL
Albumin: 4.4 g/dL (ref 3.5–5.2)
Alkaline Phosphatase: 114 U/L (ref 39–117)
CO2: 24 mEq/L (ref 19–32)
Calcium: 9.2 mg/dL (ref 8.4–10.5)
Chloride: 105 mEq/L (ref 96–112)
Glucose, Bld: 80 mg/dL (ref 70–99)
Potassium: 4.1 mEq/L (ref 3.5–5.3)
Sodium: 140 mEq/L (ref 135–145)
Total Protein: 7.3 g/dL (ref 6.0–8.3)

## 2012-01-16 LAB — CANCER ANTIGEN 27.29: CA 27.29: 19 U/mL (ref 0–39)

## 2012-01-16 NOTE — Progress Notes (Signed)
CC:   Guy Sandifer. Henderson Cloud, M.D. Thomos Lemons, M.D.  DIAGNOSIS:  Metastatic breast cancer-bone only mets.  CURRENT THERAPY: 1. Femara 2.5 mg p.o. daily. 2. Zometa 4 mg IV q.3 months.  INTERIM HISTORY:  Ms. Nancy comes in for her followup.  She is doing okay.  Unfortunately, one of her sisters passed away last year after we saw her.  She had renal failure.  This still is not clear as to what transpired and what caused her passing.  Ms. Herring did go down to [place] to be with her family after her sister passed away.  This helped her out quite a bit as her other family members were able to help her.  When we last saw her in October, her CA27.29 was normal at 15.  She does not complain of any pain.  She has had no nausea or vomiting. There has been no hot flashes or sweats.  She said that after her sister passed, she did have a "cycle."  Since then, there has been no other episodes of "female bleeding."  PHYSICAL EXAM:  General: This is a well-developed, well-nourished, black female in no obvious distress.  Vital signs: Show a temperature of 97.4, pulse 80, respiratory rate 18, blood pressure 130/80, and weight is 185. Head and neck exam shows a normocephalic, atraumatic skull.  There are no ocular or oral lesions.  No palpable cervical or supraclavicular lymph nodes.  Lungs are clear bilaterally.  Cardiac examination: Regular rate and rhythm with a normal S1 and S2.  There are no murmurs, rubs, or bruits.  Abdominal exam: Soft with good bowel sounds.  There is no palpable abdominal mass.  There is no fluid wave.  There is no hepatosplenomegaly.  Back exam:  No tenderness over the spine, ribs, or hips.  Extremities: Shows no clubbing, cyanosis, or edema.  Neurological exam: Shows no focal neurological deficits.  LABORATORY STUDIES:  White cell count is 4.6, hemoglobin 13.5, hematocrit 40.7, and platelet count 160.  IMPRESSION:  Ms. Holton is a 56 year old African American  female with metastatic breast cancer.  She has disease confined to her bones.  She is asymptomatic.  Her CA27.29 has been normal.  For now, I do not see any indication that we need to make any changes with her management.  I also do not see a need to put through any kind of tests.  I will plan to get her back in another 3 months for followup.  I told her that taking vitamin D was very critical.  She has been doing this religiously.  We are checking her vitamin D level.    ______________________________ Josph Macho, M.D. PRE/MEDQ  D:  01/15/2012  T:  01/15/2012  Job:  [id number]

## 2012-01-29 ENCOUNTER — Encounter: Payer: Self-pay | Admitting: *Deleted

## 2012-01-29 ENCOUNTER — Telehealth: Payer: Self-pay | Admitting: *Deleted

## 2012-01-29 NOTE — Progress Notes (Signed)
Per Dr. Myna Hidalgo, pt called and told her Vit D is low.  Pt states she is taking 2500 iu daily.  Per Dr. Myna Hidalgo, pt to stay on this dosage.  Left this message on pt's home phone answering machine.

## 2012-01-29 NOTE — Telephone Encounter (Signed)
Message copied by Anselm Jungling on Thu Jan 29, 2012 10:55 AM ------      Message from: Josph Macho      Created: Tue Jan 20, 2012  9:56 PM       Call, and tell her labs are ok.  Vit d is low.  How much is she taking??  Please TELL me.  pete

## 2012-01-29 NOTE — Telephone Encounter (Signed)
Called patients personal cell phone to let her know that her vitmin d levels are low per dr. Myna Hidalgo. Asked patient to call me back to give me her dose of Vitamin D that she is on.

## 2012-03-29 ENCOUNTER — Encounter: Payer: Self-pay | Admitting: Internal Medicine

## 2012-03-29 ENCOUNTER — Ambulatory Visit (INDEPENDENT_AMBULATORY_CARE_PROVIDER_SITE_OTHER): Payer: Managed Care, Other (non HMO) | Admitting: Internal Medicine

## 2012-03-29 VITALS — BP 120/80 | HR 83 | Temp 98.4°F | Resp 18 | Ht 70.0 in | Wt 184.0 lb

## 2012-03-29 DIAGNOSIS — J329 Chronic sinusitis, unspecified: Secondary | ICD-10-CM

## 2012-03-29 MED ORDER — AMOXICILLIN-POT CLAVULANATE 400-57 MG/5ML PO SUSR: ORAL | Status: AC

## 2012-03-29 MED ORDER — FLUTICASONE PROPIONATE 50 MCG/ACT NA SUSP: 2.0000 | Freq: Every day | NASAL | Status: DC

## 2012-03-31 DIAGNOSIS — J329 Chronic sinusitis, unspecified: Secondary | ICD-10-CM | POA: Insufficient documentation

## 2012-03-31 NOTE — Assessment & Plan Note (Signed)
Begin augmentin course with inhaled steroid spray. Followup if no improvement or worsening.

## 2012-03-31 NOTE — Progress Notes (Signed)
  Subjective:    Patient ID: Nancy Herring, female    DOB: [birth year], 56 y.o.   MRN: [id number]  HPI Pt presents to clinic for evaluation of cough. Notes over one week h/o nasal drainage/congestion, sinus pressure and np cough. Cough is worse at night. Has frontal ha. No f/c. Taking mucinex with mild improvement. No other alleviating or exacerbating factors.   Past Medical History  Diagnosis Date  . Cancer    Past Surgical History  Procedure Date  . Breast lumpectomy 2007 & 2009    reports that she has never smoked. She has never used smokeless tobacco. She reports that she does not drink alcohol or use illicit drugs. family history includes Diabetes in her father and Hypertension in her brother.  There is no history of Heart disease, and Colon cancer, and Breast cancer, and Prostate cancer, . No Known Allergies   Review of Systems see hpi     Objective:   Physical Exam  Nursing note and vitals reviewed. Constitutional: She appears well-developed and well-nourished. No distress.  HENT:  Head: Normocephalic and atraumatic.  Right Ear: External ear normal.  Left Ear: External ear normal.  Nose: Right sinus exhibits frontal sinus tenderness. Left sinus exhibits frontal sinus tenderness.  Mouth/Throat: Oropharynx is clear and moist. No oropharyngeal exudate.  Eyes: Conjunctivae are normal. No scleral icterus.  Neck: Neck supple.  Pulmonary/Chest: Breath sounds normal. No respiratory distress. She has no wheezes. She has no rales.  Lymphadenopathy:    She has no cervical adenopathy.  Neurological: She is alert.  Skin: Skin is warm and dry. She is not diaphoretic.  Psychiatric: She has a normal mood and affect.          Assessment & Plan:

## 2012-04-08 ENCOUNTER — Other Ambulatory Visit (HOSPITAL_BASED_OUTPATIENT_CLINIC_OR_DEPARTMENT_OTHER): Payer: Managed Care, Other (non HMO) | Admitting: Lab

## 2012-04-08 ENCOUNTER — Ambulatory Visit (HOSPITAL_BASED_OUTPATIENT_CLINIC_OR_DEPARTMENT_OTHER): Payer: Managed Care, Other (non HMO) | Admitting: Hematology & Oncology

## 2012-04-08 ENCOUNTER — Ambulatory Visit (HOSPITAL_BASED_OUTPATIENT_CLINIC_OR_DEPARTMENT_OTHER): Payer: Managed Care, Other (non HMO)

## 2012-04-08 VITALS — BP 128/73 | HR 76 | Temp 97.6°F | Ht 70.0 in | Wt 185.0 lb

## 2012-04-08 DIAGNOSIS — C50519 Malignant neoplasm of lower-outer quadrant of unspecified female breast: Secondary | ICD-10-CM

## 2012-04-08 DIAGNOSIS — C50919 Malignant neoplasm of unspecified site of unspecified female breast: Secondary | ICD-10-CM

## 2012-04-08 DIAGNOSIS — C7951 Secondary malignant neoplasm of bone: Secondary | ICD-10-CM

## 2012-04-08 DIAGNOSIS — C7952 Secondary malignant neoplasm of bone marrow: Secondary | ICD-10-CM

## 2012-04-08 LAB — COMPREHENSIVE METABOLIC PANEL
AST: 20 U/L (ref 0–37)
Alkaline Phosphatase: 123 U/L — ABNORMAL HIGH (ref 39–117)
BUN: 12 mg/dL (ref 6–23)
Creatinine, Ser: 0.89 mg/dL (ref 0.50–1.10)

## 2012-04-08 LAB — CBC WITH DIFFERENTIAL (CANCER CENTER ONLY)
BASO#: 0 10*3/uL (ref 0.0–0.2)
EOS%: 3.7 % (ref 0.0–7.0)
HGB: 12.8 g/dL (ref 11.6–15.9)
MCH: 28.4 pg (ref 26.0–34.0)
MCHC: 33.5 g/dL (ref 32.0–36.0)
MONO%: 10.3 % (ref 0.0–13.0)
NEUT#: 1.9 10*3/uL (ref 1.5–6.5)
NEUT%: 44.2 % (ref 39.6–80.0)
RDW: 12.5 % (ref 11.1–15.7)

## 2012-04-08 MED ORDER — SODIUM CHLORIDE 0.9 % IV SOLN
Freq: Once | INTRAVENOUS | Status: AC
  Administered 2012-04-08: 14:00:00 via INTRAVENOUS

## 2012-04-08 MED ORDER — ZOLEDRONIC ACID 4 MG/100ML IV SOLN
4.0000 mg | Freq: Once | INTRAVENOUS | Status: AC
  Administered 2012-04-08: 4 mg via INTRAVENOUS
  Filled 2012-04-08: qty 100

## 2012-04-08 NOTE — Patient Instructions (Signed)
Zoledronic Acid injection (Hypercalcemia, Oncology) What is this medicine? ZOLEDRONIC ACID (ZOE le dron ik AS id) lowers the amount of calcium loss from bone. It is used to treat too much calcium in your blood from cancer. It is also used to prevent complications of cancer that has spread to the bone. This medicine may be used for other purposes; ask your health care provider or pharmacist if you have questions. What should I tell my health care provider before I take this medicine? They need to know if you have any of these conditions: -aspirin-sensitive asthma -dental disease -kidney disease -an unusual or allergic reaction to zoledronic acid, other medicines, foods, dyes, or preservatives -pregnant or trying to get pregnant -breast-feeding How should I use this medicine? This medicine is for infusion into a vein. It is given by a health care professional in a hospital or clinic setting. Talk to your pediatrician regarding the use of this medicine in children. Special care may be needed. Overdosage: If you think you have taken too much of this medicine contact a poison control center or emergency room at once. NOTE: This medicine is only for you. Do not share this medicine with others. What if I miss a dose? It is important not to miss your dose. Call your doctor or health care professional if you are unable to keep an appointment. What may interact with this medicine? -certain antibiotics given by injection -NSAIDs, medicines for pain and inflammation, like ibuprofen or naproxen -some diuretics like bumetanide, furosemide -teriparatide -thalidomide This list may not describe all possible interactions. Give your health care provider a list of all the medicines, herbs, non-prescription drugs, or dietary supplements you use. Also tell them if you smoke, drink alcohol, or use illegal drugs. Some items may interact with your medicine. What should I watch for while using this medicine? Visit  your doctor or health care professional for regular checkups. It may be some time before you see the benefit from this medicine. Do not stop taking your medicine unless your doctor tells you to. Your doctor may order blood tests or other tests to see how you are doing. Women should inform their doctor if they wish to become pregnant or think they might be pregnant. There is a potential for serious side effects to an unborn child. Talk to your health care professional or pharmacist for more information. You should make sure that you get enough calcium and vitamin D while you are taking this medicine. Discuss the foods you eat and the vitamins you take with your health care professional. Some people who take this medicine have severe bone, joint, and/or muscle pain. This medicine may also increase your risk for a broken thigh bone. Tell your doctor right away if you have pain in your upper leg or groin. Tell your doctor if you have any pain that does not go away or that gets worse. What side effects may I notice from receiving this medicine? Side effects that you should report to your doctor or health care professional as soon as possible: -allergic reactions like skin rash, itching or hives, swelling of the face, lips, or tongue -anxiety, confusion, or depression -breathing problems -changes in vision -feeling faint or lightheaded, falls -jaw burning, cramping, pain -muscle cramps, stiffness, or weakness -trouble passing urine or change in the amount of urine Side effects that usually do not require medical attention (report to your doctor or health care professional if they continue or are bothersome): -bone, joint, or muscle pain -  fever -hair loss -irritation at site where injected -loss of appetite -nausea, vomiting -stomach upset -tired This list may not describe all possible side effects. Call your doctor for medical advice about side effects. You may report side effects to FDA at  1-800-FDA-1088. Where should I keep my medicine? This drug is given in a hospital or clinic and will not be stored at home. NOTE: This sheet is a summary. It may not cover all possible information. If you have questions about this medicine, talk to your doctor, pharmacist, or health care provider.  2012, [employer]. (06/06/2011 9:06:58 AM) 

## 2012-04-08 NOTE — Progress Notes (Signed)
This office note has been dictated.

## 2012-04-09 ENCOUNTER — Telehealth: Payer: Self-pay | Admitting: Hematology & Oncology

## 2012-04-09 NOTE — Progress Notes (Signed)
CC:   Guy Sandifer. Henderson Cloud, M.D. Barbette Hair. Artist Pais, DO  DIAGNOSIS:  Metastatic breast cancer, bone metastases.  CURRENT THERAPY: 1. Femara 2.5 mg p.o. daily. 2. Zometa 4 mg IV q.3 months.  INTERIM HISTORY:  Ms. Teters comes in for her followup.  She is doing okay.  She has still been pretty busy with her family.  She is heading down to [place] in June for a wedding.  She has had no problems with bony pain.  She has been active.  She is eating well.  There has been no change in bowel or bladder habits.  She has not noticed any problems with headaches.  There has been no double vision or blurred vision.  She had her last CA27.29 back on January 19.  Her last scans were done back in, I think April 2012.  This was a PET scan.  This showed stable osseous lesions with minimal residual metabolic activity.  PHYSICAL EXAMINATION:  General:  This is a well-developed, well- nourished black female in no obvious distress.  Vital Signs: Temperature 97.6, pulse 76, respiratory rate 16, blood pressure 120/73, weight is 185.  Head and Neck Exam:  Shows a normocephalic, atraumatic skull.  There are no ocular or oral lesions.  There are no palpable cervical or supraclavicular lymph nodes.  Lungs:  Clear to percussion and auscultation bilaterally.  Cardiac Exam:  Regular rate and rhythm with a normal S1 and S2.  There are no murmurs, rubs, or bruits. Abdominal Exam:  Soft with good bowel sounds.  There is no palpable abdominal mass.  There is no fluid wave.  There is no palpable hepatosplenomegaly.  Back Exam:  No tenderness over the spine, ribs, or hips.  Extremities:  Show no clubbing, cyanosis, or edema.  She has good range of motion of her joints.  LABORATORY STUDIES:  White cell count 4.4, hemoglobin 12.2, hematocrit 38.2, platelet count 177.  IMPRESSION:  Ms. Gignac is a 56 year old African American female with metastatic breast cancer.  Again, her disease is confined to her bones.  She  is doing well.  We have been following her now for, I think 3 or 4 years.  She has had no evidence of progression as far as I can tell.  We will continue her on the Zometa every 3 months.  I think this is a good time frame for followup for her.  We will plan on getting her back in 3 months' time for followup.    ______________________________ Josph Macho, M.D. PRE/MEDQ  D:  04/08/2012  T:  04/09/2012  Job:  [id number]

## 2012-04-09 NOTE — Telephone Encounter (Signed)
Mailed 07-08-12 schedule

## 2012-04-14 ENCOUNTER — Telehealth: Payer: Self-pay | Admitting: *Deleted

## 2012-04-14 NOTE — Telephone Encounter (Signed)
Called patient and left message on personal cell phone that her labwork was good per dr. Myna Hidalgo

## 2012-04-14 NOTE — Telephone Encounter (Signed)
Message copied by Anselm Jungling on Wed Apr 14, 2012 10:33 AM ------      Message from: Arlan Organ R      Created: Mon Apr 12, 2012  6:47 PM       Call - labs look great.  pete

## 2012-04-26 ENCOUNTER — Telehealth: Payer: Self-pay | Admitting: *Deleted

## 2012-05-20 NOTE — Telephone Encounter (Signed)
Opened in error

## 2012-07-07 ENCOUNTER — Telehealth: Payer: Self-pay | Admitting: Hematology & Oncology

## 2012-07-07 NOTE — Telephone Encounter (Signed)
Pt called and cx 07/08/12 appt and resch for 07/21/12

## 2012-07-08 ENCOUNTER — Other Ambulatory Visit: Payer: Managed Care, Other (non HMO) | Admitting: Lab

## 2012-07-08 ENCOUNTER — Ambulatory Visit: Payer: Managed Care, Other (non HMO) | Admitting: Hematology & Oncology

## 2012-07-21 ENCOUNTER — Ambulatory Visit (HOSPITAL_BASED_OUTPATIENT_CLINIC_OR_DEPARTMENT_OTHER): Payer: Managed Care, Other (non HMO) | Admitting: Medical

## 2012-07-21 ENCOUNTER — Ambulatory Visit (HOSPITAL_BASED_OUTPATIENT_CLINIC_OR_DEPARTMENT_OTHER): Payer: Managed Care, Other (non HMO)

## 2012-07-21 ENCOUNTER — Other Ambulatory Visit: Payer: Managed Care, Other (non HMO) | Admitting: Lab

## 2012-07-21 ENCOUNTER — Other Ambulatory Visit (HOSPITAL_BASED_OUTPATIENT_CLINIC_OR_DEPARTMENT_OTHER): Payer: Managed Care, Other (non HMO) | Admitting: Lab

## 2012-07-21 ENCOUNTER — Ambulatory Visit: Payer: Managed Care, Other (non HMO) | Admitting: Hematology & Oncology

## 2012-07-21 VITALS — BP 123/77 | HR 72 | Temp 97.0°F | Ht 70.0 in | Wt 183.0 lb

## 2012-07-21 DIAGNOSIS — C50519 Malignant neoplasm of lower-outer quadrant of unspecified female breast: Secondary | ICD-10-CM

## 2012-07-21 DIAGNOSIS — C50919 Malignant neoplasm of unspecified site of unspecified female breast: Secondary | ICD-10-CM

## 2012-07-21 DIAGNOSIS — C7952 Secondary malignant neoplasm of bone marrow: Secondary | ICD-10-CM

## 2012-07-21 DIAGNOSIS — C7951 Secondary malignant neoplasm of bone: Secondary | ICD-10-CM

## 2012-07-21 LAB — CMP (CANCER CENTER ONLY)
CO2: 27 mEq/L (ref 18–33)
Creat: 0.9 mg/dl (ref 0.6–1.2)
Glucose, Bld: 112 mg/dL (ref 73–118)
Sodium: 143 mEq/L (ref 128–145)
Total Bilirubin: 0.6 mg/dl (ref 0.20–1.60)
Total Protein: 8.1 g/dL (ref 6.4–8.1)

## 2012-07-21 LAB — CBC WITH DIFFERENTIAL (CANCER CENTER ONLY)
BASO#: 0 10*3/uL (ref 0.0–0.2)
Eosinophils Absolute: 0.2 10*3/uL (ref 0.0–0.5)
HCT: 39.1 % (ref 34.8–46.6)
HGB: 13.3 g/dL (ref 11.6–15.9)
LYMPH%: 39.3 % (ref 14.0–48.0)
MCV: 84 fL (ref 81–101)
MONO#: 0.3 10*3/uL (ref 0.1–0.9)
NEUT%: 48.2 % (ref 39.6–80.0)
RBC: 4.66 10*6/uL (ref 3.70–5.32)
WBC: 3.9 10*3/uL (ref 3.9–10.0)

## 2012-07-21 MED ORDER — ZOLEDRONIC ACID 4 MG/100ML IV SOLN
4.0000 mg | Freq: Once | INTRAVENOUS | Status: AC
  Administered 2012-07-21: 4 mg via INTRAVENOUS
  Filled 2012-07-21: qty 100

## 2012-07-21 MED ORDER — SODIUM CHLORIDE 0.9 % IV SOLN
Freq: Once | INTRAVENOUS | Status: AC
  Administered 2012-07-21: 13:00:00 via INTRAVENOUS

## 2012-07-21 NOTE — Patient Instructions (Signed)
Zoledronic Acid injection (Hypercalcemia, Oncology) What is this medicine? ZOLEDRONIC ACID (ZOE le dron ik AS id) lowers the amount of calcium loss from bone. It is used to treat too much calcium in your blood from cancer. It is also used to prevent complications of cancer that has spread to the bone. This medicine may be used for other purposes; ask your health care provider or pharmacist if you have questions. What should I tell my health care provider before I take this medicine? They need to know if you have any of these conditions: -aspirin-sensitive asthma -dental disease -kidney disease -an unusual or allergic reaction to zoledronic acid, other medicines, foods, dyes, or preservatives -pregnant or trying to get pregnant -breast-feeding How should I use this medicine? This medicine is for infusion into a vein. It is given by a health care professional in a hospital or clinic setting. Talk to your pediatrician regarding the use of this medicine in children. Special care may be needed. Overdosage: If you think you have taken too much of this medicine contact a poison control center or emergency room at once. NOTE: This medicine is only for you. Do not share this medicine with others. What if I miss a dose? It is important not to miss your dose. Call your doctor or health care professional if you are unable to keep an appointment. What may interact with this medicine? -certain antibiotics given by injection -NSAIDs, medicines for pain and inflammation, like ibuprofen or naproxen -some diuretics like bumetanide, furosemide -teriparatide -thalidomide This list may not describe all possible interactions. Give your health care provider a list of all the medicines, herbs, non-prescription drugs, or dietary supplements you use. Also tell them if you smoke, drink alcohol, or use illegal drugs. Some items may interact with your medicine. What should I watch for while using this medicine? Visit  your doctor or health care professional for regular checkups. It may be some time before you see the benefit from this medicine. Do not stop taking your medicine unless your doctor tells you to. Your doctor may order blood tests or other tests to see how you are doing. Women should inform their doctor if they wish to become pregnant or think they might be pregnant. There is a potential for serious side effects to an unborn child. Talk to your health care professional or pharmacist for more information. You should make sure that you get enough calcium and vitamin D while you are taking this medicine. Discuss the foods you eat and the vitamins you take with your health care professional. Some people who take this medicine have severe bone, joint, and/or muscle pain. This medicine may also increase your risk for a broken thigh bone. Tell your doctor right away if you have pain in your upper leg or groin. Tell your doctor if you have any pain that does not go away or that gets worse. What side effects may I notice from receiving this medicine? Side effects that you should report to your doctor or health care professional as soon as possible: -allergic reactions like skin rash, itching or hives, swelling of the face, lips, or tongue -anxiety, confusion, or depression -breathing problems -changes in vision -feeling faint or lightheaded, falls -jaw burning, cramping, pain -muscle cramps, stiffness, or weakness -trouble passing urine or change in the amount of urine Side effects that usually do not require medical attention (report to your doctor or health care professional if they continue or are bothersome): -bone, joint, or muscle pain -  fever -hair loss -irritation at site where injected -loss of appetite -nausea, vomiting -stomach upset -tired This list may not describe all possible side effects. Call your doctor for medical advice about side effects. You may report side effects to FDA at  1-800-FDA-1088. Where should I keep my medicine? This drug is given in a hospital or clinic and will not be stored at home. NOTE: This sheet is a summary. It may not cover all possible information. If you have questions about this medicine, talk to your doctor, pharmacist, or health care provider.  2012, [employer]. (06/06/2011 9:06:58 AM) 

## 2012-07-21 NOTE — Progress Notes (Signed)
Patient Name : Nancy Herring, Nancy Herring MR #[id number] DOB:October 16, 1956 Encounter Date: 07/21/2012 Dictated by Eunice Blase, PA-C  Diagnosis: Metastatic breast cancer, bone metastasis.  Current therapy: #1 Zometa 4 mg IV every 3 months #2 patient was on Femara 2.5 mg by mouth daily for about one year, and then decided to take herself off of it.  Interim history: Nancy Herring presents today for an office followup visit.  Overall, she reports that she's been doing quite well.  She is currently off of her Femara 2.5 mg daily.  She reports, that she's actually probably been off of it for about one year.  She was only taking it for one year, and then decided to take herself off of it.  I did go into a detailed explanation of why she should be on the Femara.  However, she feels, that the Zometa is enough for her and she does not need the Femara.  She does report some intermittent lower back pain, mainly, when she stands for long time, but otherwise does not report any other bony pain.  Overall, she is quite active.  She is eating well.  There is no change in bowel or bladder, habits.  She does not report any headaches or vision, changes.  She denies any nausea, vomiting, diarrhea, constipation, chest pain, shortness of breath, cough.  She denies any obvious, bleeding any fevers, chills, or night sweats.  Her last CA 27.29 on 04/08/2012 was 16.  Her last PET scan back in April showed stable.  Osseous lesions with minimal residual metabolic activity.  Overall, Nancy Herring is doing quite well without any complaints.  Review of Systems:Significant for intermittent lower back pain, otherwise: Pt. Denies any changes in their vision, hearing, adenopathy, fevers, chills, nausea, vomiting, diarrhea, constipation, chest pain, shortness of breath, passing blood, passing out, blacking out,  any changes in skin, joints, neurologic or psychiatric except as noted.  Physical Exam: This is a pleasant, 56 year old, African American,  female, in no obvious distress Vitals: Temperature 97.0 degrees, pulse 72, respirations 20, blood pressure 123/77, weight 183 pounds HEENT reveals a normocephalic, atraumatic skull, no scleral icterus, no oral lesions  Neck is supple without any cervical or supraclavicular adenopathy.  Lungs are clear to auscultation bilaterally. There are no wheezes, rales or rhonci Cardiac is regular rate and rhythm with a normal S1 and S2. There are no murmurs, rubs, or bruits.  Abdomen is soft with good bowel sounds there's a palpable mass. There is no palpable hepatosplenomegaly. There is no palpable fluid wave.  Musculoskeletal no tenderness of the spine, ribs, or hips.  Extremities there are no clubbing, cyanosis, or edema.  Skin no petechia, purpura or ecchymosis Neurologic is nonfocal.  Laboratory Data: White count 3.9, hemoglobin 13.3, hematocrit 39.1, platelets 179,000.  BUN 8, creatinine 0.9  Current Outpatient Prescriptions on File Prior to Visit  Medication Sig Dispense Refill  . cholecalciferol (VITAMIN D) 1000 UNITS tablet Take 1,000 Units by mouth daily.      . Zoledronic Acid (ZOMETA IV) Inject into the vein. Every 3 months      . fluticasone (FLONASE) 50 MCG/ACT nasal spray Place 2 sprays into the nose daily.  16 g  6   Assessment/Plan: This is a very pleasant, 56 year old, African American, female, with the following issues: #1.  Metastatic breast cancer-overall, her disease is confined to her bones.  It was her decision to come off of the Femara.  She will remain on Zometa 4 mg every 3 months.  We will continue to monitor her closely.  She has had no evidence of progression.  As far as I can tell.  #2 followup-Nancy Herring will followup with [place] in 3 months, but before then should there be questions or concerns.

## 2012-09-07 ENCOUNTER — Other Ambulatory Visit: Payer: Self-pay | Admitting: Hematology & Oncology

## 2012-09-07 DIAGNOSIS — Z1231 Encounter for screening mammogram for malignant neoplasm of breast: Secondary | ICD-10-CM

## 2012-09-07 DIAGNOSIS — Z853 Personal history of malignant neoplasm of breast: Secondary | ICD-10-CM

## 2012-09-16 ENCOUNTER — Ambulatory Visit
Admission: RE | Admit: 2012-09-16 | Discharge: 2012-09-16 | Disposition: A | Discharge status: Home / Self Care | Payer: Managed Care, Other (non HMO) | Source: Ambulatory Visit | Attending: Hematology & Oncology | Admitting: Hematology & Oncology

## 2012-09-16 DIAGNOSIS — Z1231 Encounter for screening mammogram for malignant neoplasm of breast: Secondary | ICD-10-CM

## 2012-09-16 DIAGNOSIS — Z853 Personal history of malignant neoplasm of breast: Secondary | ICD-10-CM

## 2012-10-26 ENCOUNTER — Ambulatory Visit (HOSPITAL_BASED_OUTPATIENT_CLINIC_OR_DEPARTMENT_OTHER)
Admission: RE | Admit: 2012-10-26 | Discharge: 2012-10-26 | Disposition: A | Discharge status: Home / Self Care | Payer: Managed Care, Other (non HMO) | Source: Ambulatory Visit | Attending: Internal Medicine | Admitting: Internal Medicine

## 2012-10-26 ENCOUNTER — Ambulatory Visit (HOSPITAL_BASED_OUTPATIENT_CLINIC_OR_DEPARTMENT_OTHER): Payer: Managed Care, Other (non HMO)

## 2012-10-26 ENCOUNTER — Ambulatory Visit (HOSPITAL_BASED_OUTPATIENT_CLINIC_OR_DEPARTMENT_OTHER): Payer: Managed Care, Other (non HMO) | Admitting: Medical

## 2012-10-26 ENCOUNTER — Other Ambulatory Visit (HOSPITAL_BASED_OUTPATIENT_CLINIC_OR_DEPARTMENT_OTHER): Payer: Managed Care, Other (non HMO) | Admitting: Lab

## 2012-10-26 ENCOUNTER — Ambulatory Visit (INDEPENDENT_AMBULATORY_CARE_PROVIDER_SITE_OTHER): Payer: Managed Care, Other (non HMO) | Admitting: Internal Medicine

## 2012-10-26 ENCOUNTER — Other Ambulatory Visit: Payer: Self-pay | Admitting: Internal Medicine

## 2012-10-26 ENCOUNTER — Encounter: Payer: Self-pay | Admitting: Internal Medicine

## 2012-10-26 VITALS — BP 138/66 | HR 76 | Temp 98.6°F | Resp 17 | Ht 70.0 in | Wt 183.0 lb

## 2012-10-26 VITALS — BP 128/78 | HR 68 | Temp 98.0°F | Resp 14 | Ht 70.0 in | Wt 184.8 lb

## 2012-10-26 DIAGNOSIS — C50919 Malignant neoplasm of unspecified site of unspecified female breast: Secondary | ICD-10-CM

## 2012-10-26 DIAGNOSIS — R079 Chest pain, unspecified: Secondary | ICD-10-CM

## 2012-10-26 DIAGNOSIS — C50519 Malignant neoplasm of lower-outer quadrant of unspecified female breast: Secondary | ICD-10-CM

## 2012-10-26 DIAGNOSIS — R0781 Pleurodynia: Secondary | ICD-10-CM

## 2012-10-26 DIAGNOSIS — C7951 Secondary malignant neoplasm of bone: Secondary | ICD-10-CM

## 2012-10-26 DIAGNOSIS — M545 Low back pain: Secondary | ICD-10-CM

## 2012-10-26 DIAGNOSIS — H6120 Impacted cerumen, unspecified ear: Secondary | ICD-10-CM

## 2012-10-26 LAB — CBC WITH DIFFERENTIAL (CANCER CENTER ONLY)
BASO%: 0.3 % (ref 0.0–2.0)
HCT: 37.4 % (ref 34.8–46.6)
LYMPH#: 1.4 10*3/uL (ref 0.9–3.3)
MONO#: 0.3 10*3/uL (ref 0.1–0.9)
NEUT%: 52.4 % (ref 39.6–80.0)
Platelets: 163 10*3/uL (ref 145–400)
RDW: 12.5 % (ref 11.1–15.7)
WBC: 3.9 10*3/uL (ref 3.9–10.0)

## 2012-10-26 MED ORDER — SODIUM CHLORIDE 0.9 % IV SOLN
Freq: Once | INTRAVENOUS | Status: AC
  Administered 2012-10-26: 12:00:00 via INTRAVENOUS

## 2012-10-26 MED ORDER — ZOLEDRONIC ACID 4 MG/5ML IV CONC
4.0000 mg | Freq: Once | INTRAVENOUS | Status: AC
  Administered 2012-10-26: 4 mg via INTRAVENOUS
  Filled 2012-10-26: qty 5

## 2012-10-26 NOTE — Progress Notes (Signed)
Diagnosis: Metastatic breast cancer, bone metastasis.  Current therapy: #1 Zometa 4 mg IV every 3 months #2 patient was on Femara 2.5 mg by mouth daily for about one year, and then decided to take herself off of it.  Interim history: Nancy Herring presents today for an office followup visit.  Overall, she reports that she's been doing quite well.  She states she just recently had her bilateral mammogram on 09/16/2012, which revealed benign findings. She is currently off of her Femara 2.5 mg daily.  She reports, that she's actually probably been off of it for about one year.  She was only taking it for one year, and then decided to take herself off of it.  I did go into a detailed explanation of why she should be on the Femara.  However, she feels, that the Zometa is enough for her and she does not need the Femara.  She does report some intermittent lower back pain, mainly, when she stands for long time, but otherwise does not report any other bony pain.  Overall, she is quite active.  She is eating well.  There is no change in bowel or bladder, habits.  She does not report any headaches or vision, changes.  She denies any nausea, vomiting, diarrhea, constipation, chest pain, shortness of breath, cough.  She denies any obvious, bleeding any fevers, chills, or night sweats.  Her last CA 27.29 on 07/21/2012 was 9  Her last PET scan back in April showed stable.  Osseous lesions with minimal residual metabolic activity.  Overall, [name] is doing quite well without any complaints.  Review of Systems: Constitutional:Negative for malaise/fatigue, fever, chills, weight loss, diaphoresis, activity change, appetite change, and unexpected weight change.  HEENT: Negative for double vision, blurred vision, visual loss, ear pain, tinnitus, congestion, rhinorrhea, epistaxis sore throat or sinus disease, oral pain/lesion, tongue soreness Respiratory: Negative for cough, chest tightness, shortness of breath, wheezing  and stridor.  Cardiovascular: Negative for chest pain, palpitations, leg swelling, orthopnea, PND, DOE or claudication Gastrointestinal: Negative for nausea, vomiting, abdominal pain, diarrhea, constipation, blood in stool, melena, hematochezia, abdominal distention, anal bleeding, rectal pain, anorexia and hematemesis.  Genitourinary: Negative for dysuria, frequency, hematuria,  Musculoskeletal: Negative for myalgias, back pain, joint swelling, arthralgias and gait problem.  Skin: Negative for rash, color change, pallor and wound.  Neurological:. Negative for dizziness/light-headedness, tremors, seizures, syncope, facial asymmetry, speech difficulty, weakness, numbness, headaches and paresthesias.  Hematological: Negative for adenopathy. Does not bruise/bleed easily.  Psychiatric/Behavioral:  Negative for depression, no loss of interest in normal activity or change in sleep pattern.   Physical Exam: This is a pleasant, 56 year old, African American, female, in no obvious distress Vitals: Temperature 98.6 degrees, pulse 76, respirations 16, blood pressure 130/66, weight 183 pounds HEENT reveals a normocephalic, atraumatic skull, no scleral icterus, no oral lesions  Neck is supple without any cervical or supraclavicular adenopathy.  Lungs are clear to auscultation bilaterally. There are no wheezes, rales or rhonci Cardiac is regular rate and rhythm with a normal S1 and S2. There are no murmurs, rubs, or bruits.  Abdomen is soft with good bowel sounds there's a palpable mass. There is no palpable hepatosplenomegaly. There is no palpable fluid wave.  Musculoskeletal no tenderness of the spine, ribs, or hips.  Extremities there are no clubbing, cyanosis, or edema.  Skin no petechia, purpura or ecchymosis Neurologic is nonfocal. Bilateral breast exam: Right breast reveals no palpable masses, erythema, nipple discharge, or retraction.  There is no right  axillary adenopathy.  Left breast exam reveals a  well healed lumpectomy scar at the 4:00 position.  There is no palpable masses, erythema, nipple discharge, or retraction.  There is no left axillary adenopathy.  Laboratory Data: White count 3.9, hemoglobin 12.7, hematocrit 37.4, platelets 163,000  Current Outpatient Prescriptions on File Prior to Visit  Medication Sig Dispense Refill  . cholecalciferol (VITAMIN D) 1000 UNITS tablet Take 1,000 Units by mouth daily.      . fluticasone (FLONASE) 50 MCG/ACT nasal spray Place 2 sprays into the nose daily.  16 g  6  . Multiple Vitamin (MULTI-VITAMIN DAILY PO) Take by mouth every morning.      . Zoledronic Acid (ZOMETA IV) Inject into the vein. Every 3 months       Assessment/Plan: This is a very pleasant, 56 year old, African American, female, with the following issues:  #1.  Metastatic breast cancer-overall, her disease is confined to her bones.  It was her decision to come off of the Femara.  She will remain on Zometa 4 mg every 3 months.  We will continue to monitor her closely.  She has had no evidence of progression as far as I can tell.  #2.  Supportive therapy.  She will continue receiving Zometa 4 mg IV every 3 months.  #3. followup-[name] will followup with [place] in 3 months, but before then should there be questions or concerns.

## 2012-10-26 NOTE — Patient Instructions (Addendum)
Please try over the counter debrox to loosen the wax in the right ear

## 2012-10-26 NOTE — Assessment & Plan Note (Signed)
Improve status post irrigation but no resolution. Attempt over-the-counter Debrox. Followup if no improvement or worsening.

## 2012-10-26 NOTE — Progress Notes (Signed)
  Subjective:    Patient ID: Nancy Herring, female    DOB: [birth year], 56 y.o.   MRN: [id number]  HPI patient presented to clinic for evaluation of decreased hearing. Notes one-month history of intermittent decreased hearing of right ear. Has attempted over-the-counter ear drops without significant improvement. No associated neurologic symptoms. Also notes approximately 4 month history of intermittent right anterior rib pain and tenderness. No injury or trauma. Does have history of metastatic breast cancer to bone.  Past Medical History  Diagnosis Date  . Cancer    Past Surgical History  Procedure Date  . Breast lumpectomy 2007 & 2009    reports that she has never smoked. She has never used smokeless tobacco. She reports that she does not drink alcohol or use illicit drugs. family history includes Diabetes in her father and Hypertension in her brother.  There is no history of Heart disease, and Colon cancer, and Breast cancer, and Prostate cancer, . No Known Allergies     Review of Systems see hpi     Objective:   Physical Exam  Nursing note and vitals reviewed. Constitutional: She appears well-developed and well-nourished. No distress.  HENT:  Head: Normocephalic and atraumatic.  Right Ear: External ear normal.  Left Ear: External ear normal.       Right ear canal-cerumen impaction. Irrigated and reexamined with noted 25-30% improvement   Musculoskeletal:       Mild tenderness to palpation right anterior lower ribs. No bony abnormality  Neurological: She is alert.  Skin: Skin is warm and dry. She is not diaphoretic.  Psychiatric: She has a normal mood and affect.          Assessment & Plan:

## 2012-10-26 NOTE — Assessment & Plan Note (Signed)
Obtain right rib series plain radiograph given history of metastatic breast cancer.

## 2012-10-26 NOTE — Patient Instructions (Signed)
Zoledronic Acid injection (Hypercalcemia, Oncology) What is this medicine? ZOLEDRONIC ACID (ZOE le dron ik AS id) lowers the amount of calcium loss from bone. It is used to treat too much calcium in your blood from cancer. It is also used to prevent complications of cancer that has spread to the bone. This medicine may be used for other purposes; ask your health care provider or pharmacist if you have questions. What should I tell my health care provider before I take this medicine? They need to know if you have any of these conditions: -aspirin-sensitive asthma -dental disease -kidney disease -an unusual or allergic reaction to zoledronic acid, other medicines, foods, dyes, or preservatives -pregnant or trying to get pregnant -breast-feeding How should I use this medicine? This medicine is for infusion into a vein. It is given by a health care professional in a hospital or clinic setting. Talk to your pediatrician regarding the use of this medicine in children. Special care may be needed. Overdosage: If you think you have taken too much of this medicine contact a poison control center or emergency room at once. NOTE: This medicine is only for you. Do not share this medicine with others. What if I miss a dose? It is important not to miss your dose. Call your doctor or health care professional if you are unable to keep an appointment. What may interact with this medicine? -certain antibiotics given by injection -NSAIDs, medicines for pain and inflammation, like ibuprofen or naproxen -some diuretics like bumetanide, furosemide -teriparatide -thalidomide This list may not describe all possible interactions. Give your health care provider a list of all the medicines, herbs, non-prescription drugs, or dietary supplements you use. Also tell them if you smoke, drink alcohol, or use illegal drugs. Some items may interact with your medicine. What should I watch for while using this medicine? Visit  your doctor or health care professional for regular checkups. It may be some time before you see the benefit from this medicine. Do not stop taking your medicine unless your doctor tells you to. Your doctor may order blood tests or other tests to see how you are doing. Women should inform their doctor if they wish to become pregnant or think they might be pregnant. There is a potential for serious side effects to an unborn child. Talk to your health care professional or pharmacist for more information. You should make sure that you get enough calcium and vitamin D while you are taking this medicine. Discuss the foods you eat and the vitamins you take with your health care professional. Some people who take this medicine have severe bone, joint, and/or muscle pain. This medicine may also increase your risk for a broken thigh bone. Tell your doctor right away if you have pain in your upper leg or groin. Tell your doctor if you have any pain that does not go away or that gets worse. What side effects may I notice from receiving this medicine? Side effects that you should report to your doctor or health care professional as soon as possible: -allergic reactions like skin rash, itching or hives, swelling of the face, lips, or tongue -anxiety, confusion, or depression -breathing problems -changes in vision -feeling faint or lightheaded, falls -jaw burning, cramping, pain -muscle cramps, stiffness, or weakness -trouble passing urine or change in the amount of urine Side effects that usually do not require medical attention (report to your doctor or health care professional if they continue or are bothersome): -bone, joint, or muscle pain -  fever -hair loss -irritation at site where injected -loss of appetite -nausea, vomiting -stomach upset -tired This list may not describe all possible side effects. Call your doctor for medical advice about side effects. You may report side effects to FDA at  1-800-FDA-1088. Where should I keep my medicine? This drug is given in a hospital or clinic and will not be stored at home. NOTE: This sheet is a summary. It may not cover all possible information. If you have questions about this medicine, talk to your doctor, pharmacist, or health care provider.  2012, [employer]. (06/06/2011 9:06:58 AM) 

## 2012-10-27 LAB — COMPREHENSIVE METABOLIC PANEL
AST: 16 U/L (ref 0–37)
Alkaline Phosphatase: 118 U/L — ABNORMAL HIGH (ref 39–117)
BUN: 10 mg/dL (ref 6–23)
Creatinine, Ser: 0.97 mg/dL (ref 0.50–1.10)
Total Bilirubin: 0.5 mg/dL (ref 0.3–1.2)

## 2013-01-25 ENCOUNTER — Ambulatory Visit (HOSPITAL_BASED_OUTPATIENT_CLINIC_OR_DEPARTMENT_OTHER): Payer: Managed Care, Other (non HMO) | Admitting: Hematology & Oncology

## 2013-01-25 ENCOUNTER — Other Ambulatory Visit (HOSPITAL_BASED_OUTPATIENT_CLINIC_OR_DEPARTMENT_OTHER): Payer: Managed Care, Other (non HMO) | Admitting: Lab

## 2013-01-25 ENCOUNTER — Ambulatory Visit (HOSPITAL_BASED_OUTPATIENT_CLINIC_OR_DEPARTMENT_OTHER): Payer: Managed Care, Other (non HMO)

## 2013-01-25 VITALS — BP 114/61 | HR 72 | Temp 97.4°F | Resp 16 | Ht 70.0 in | Wt 179.0 lb

## 2013-01-25 DIAGNOSIS — C50919 Malignant neoplasm of unspecified site of unspecified female breast: Secondary | ICD-10-CM

## 2013-01-25 DIAGNOSIS — C50519 Malignant neoplasm of lower-outer quadrant of unspecified female breast: Secondary | ICD-10-CM

## 2013-01-25 DIAGNOSIS — C7951 Secondary malignant neoplasm of bone: Secondary | ICD-10-CM

## 2013-01-25 DIAGNOSIS — M810 Age-related osteoporosis without current pathological fracture: Secondary | ICD-10-CM

## 2013-01-25 LAB — CBC WITH DIFFERENTIAL (CANCER CENTER ONLY)
BASO#: 0 10*3/uL (ref 0.0–0.2)
Eosinophils Absolute: 0.2 10*3/uL (ref 0.0–0.5)
HGB: 12.8 g/dL (ref 11.6–15.9)
LYMPH%: 38.9 % (ref 14.0–48.0)
MCH: 27.7 pg (ref 26.0–34.0)
MCV: 85 fL (ref 81–101)
MONO#: 0.4 10*3/uL (ref 0.1–0.9)
MONO%: 9.1 % (ref 0.0–13.0)
RBC: 4.62 10*6/uL (ref 3.70–5.32)
WBC: 4.5 10*3/uL (ref 3.9–10.0)

## 2013-01-25 LAB — COMPREHENSIVE METABOLIC PANEL
ALT: 18 U/L (ref 0–35)
AST: 16 U/L (ref 0–37)
Albumin: 4.3 g/dL (ref 3.5–5.2)
Alkaline Phosphatase: 118 U/L — ABNORMAL HIGH (ref 39–117)
Calcium: 9.5 mg/dL (ref 8.4–10.5)
Chloride: 104 mEq/L (ref 96–112)
Potassium: 4.1 mEq/L (ref 3.5–5.3)
Sodium: 139 mEq/L (ref 135–145)

## 2013-01-25 MED ORDER — ZOLEDRONIC ACID 4 MG/100ML IV SOLN
4.0000 mg | Freq: Once | INTRAVENOUS | Status: AC
  Administered 2013-01-25: 4 mg via INTRAVENOUS
  Filled 2013-01-25: qty 100

## 2013-01-25 MED ORDER — SODIUM CHLORIDE 0.9 % IV SOLN
INTRAVENOUS | Status: DC
  Administered 2013-01-25: 12:00:00 via INTRAVENOUS

## 2013-01-25 NOTE — Progress Notes (Signed)
This office note has been dictated.

## 2013-01-26 NOTE — Progress Notes (Signed)
CC:   Marguarite Arbour, MD  DIAGNOSIS:  Metastatic breast cancer-bone metastases.  CURRENT THERAPY:  Zometa 4 mg IV every 3 months.  INTERIM HISTORY:  Nancy Herring comes in for her followup.  We see her every 3 months.  Since we last saw her, she has been doing quite well. She has had no bony complaints.  She just got done with the [employer] and she does feel a little bit tired.  When I saw her in November, her CA27.29 was 12.  There is no change in bowel or bladder habits.  She has had no hot flashes or sweats.  There is no leg swelling.  She has had no cough or shortness of breath.  She has had no headache.  PHYSICAL EXAMINATION:  General:  This is a well-developed, well- nourished black female in no obvious distress.  Vital signs: Temperature of 97.4, pulse 72, respiratory rate 16, blood pressure 114/61.  Weight is 179.  Head and neck:  Normocephalic, atraumatic skull.  There are no ocular or oral lesions.  There are no palpable cervical or supraclavicular lymph nodes.  Lungs:  Clear bilaterally. Cardiac:  Regular rate and rhythm with a normal S1 and S2.  There are no murmurs, rubs, or bruits.  Breasts:  Right breast with no masses, edema, or erythema.  There is no right axillary adenopathy.  Left breast shows well-healed lumpectomy at the 4 o'clock position.  There is some slight contraction and hyperpigmentation of the left breast.  No distinct mass noted in the left breast.  There is no left axillary adenopathy. Abdomen:  Soft with good bowel sounds.  There is no fluid wave.  No palpable hepatosplenomegaly.  Back:  No tenderness over the spine, ribs, or hips.  Extremities:  No clubbing, cyanosis or edema.  LABORATORY STUDIES:  White cell count is 4.5, hemoglobin 12.8, hematocrit 39.1, platelet count 163.  IMPRESSION:  Nancy Herring is a very nice 57 year old African American female with metastatic breast cancer.  She has oligometastatic disease. We last did her PET scan back in  April 2012.  The PET scan showed stable osseous metastasis with minimal residual activity.  There was no evidence of obvious progressive disease.  Clinically, I do not see any evidence of progression.  We will go ahead and plan to get her back in 3 more months.    ______________________________ Josph Macho, M.D. PRE/MEDQ  D:  01/25/2013  T:  01/26/2013  Job:  [id number]

## 2013-04-26 ENCOUNTER — Ambulatory Visit: Payer: Managed Care, Other (non HMO) | Admitting: Medical

## 2013-04-26 ENCOUNTER — Ambulatory Visit: Payer: Managed Care, Other (non HMO)

## 2013-04-26 ENCOUNTER — Other Ambulatory Visit: Payer: Managed Care, Other (non HMO) | Admitting: Lab

## 2013-04-26 ENCOUNTER — Telehealth: Payer: Self-pay | Admitting: Hematology & Oncology

## 2013-04-26 NOTE — Progress Notes (Signed)
Patient did not come for her appointment today.

## 2013-04-26 NOTE — Telephone Encounter (Signed)
Pt rescheduled 5-6 to 5-20

## 2013-05-10 ENCOUNTER — Ambulatory Visit (HOSPITAL_BASED_OUTPATIENT_CLINIC_OR_DEPARTMENT_OTHER): Payer: Managed Care, Other (non HMO) | Admitting: Medical

## 2013-05-10 ENCOUNTER — Ambulatory Visit (HOSPITAL_BASED_OUTPATIENT_CLINIC_OR_DEPARTMENT_OTHER): Payer: Managed Care, Other (non HMO)

## 2013-05-10 ENCOUNTER — Other Ambulatory Visit (HOSPITAL_BASED_OUTPATIENT_CLINIC_OR_DEPARTMENT_OTHER): Payer: Managed Care, Other (non HMO) | Admitting: Lab

## 2013-05-10 VITALS — BP 115/71 | HR 70 | Temp 97.9°F | Resp 16 | Ht 70.0 in | Wt 181.0 lb

## 2013-05-10 DIAGNOSIS — C7951 Secondary malignant neoplasm of bone: Secondary | ICD-10-CM

## 2013-05-10 DIAGNOSIS — C50919 Malignant neoplasm of unspecified site of unspecified female breast: Secondary | ICD-10-CM

## 2013-05-10 DIAGNOSIS — C50519 Malignant neoplasm of lower-outer quadrant of unspecified female breast: Secondary | ICD-10-CM

## 2013-05-10 DIAGNOSIS — C7952 Secondary malignant neoplasm of bone marrow: Secondary | ICD-10-CM

## 2013-05-10 DIAGNOSIS — M810 Age-related osteoporosis without current pathological fracture: Secondary | ICD-10-CM

## 2013-05-10 LAB — CBC WITH DIFFERENTIAL (CANCER CENTER ONLY)
BASO#: 0 10*3/uL (ref 0.0–0.2)
EOS%: 4.4 % (ref 0.0–7.0)
Eosinophils Absolute: 0.2 10*3/uL (ref 0.0–0.5)
LYMPH%: 34.6 % (ref 14.0–48.0)
MCH: 28.3 pg (ref 26.0–34.0)
MCHC: 33 g/dL (ref 32.0–36.0)
MCV: 86 fL (ref 81–101)
MONO%: 8.1 % (ref 0.0–13.0)
NEUT#: 2.4 10*3/uL (ref 1.5–6.5)
Platelets: 168 10*3/uL (ref 145–400)

## 2013-05-10 MED ORDER — ZOLEDRONIC ACID 4 MG/100ML IV SOLN
4.0000 mg | Freq: Once | INTRAVENOUS | Status: AC
  Administered 2013-05-10: 4 mg via INTRAVENOUS
  Filled 2013-05-10: qty 100

## 2013-05-10 MED ORDER — SODIUM CHLORIDE 0.9 % IV SOLN
Freq: Once | INTRAVENOUS | Status: AC
  Administered 2013-05-10: 11:00:00 via INTRAVENOUS

## 2013-05-10 NOTE — Patient Instructions (Signed)
Zoledronic Acid injection (Hypercalcemia, Oncology) What is this medicine? ZOLEDRONIC ACID (ZOE le dron ik AS id) lowers the amount of calcium loss from bone. It is used to treat too much calcium in your blood from cancer. It is also used to prevent complications of cancer that has spread to the bone. This medicine may be used for other purposes; ask your health care provider or pharmacist if you have questions. What should I tell my health care provider before I take this medicine? They need to know if you have any of these conditions: -aspirin-sensitive asthma -dental disease -kidney disease -an unusual or allergic reaction to zoledronic acid, other medicines, foods, dyes, or preservatives -pregnant or trying to get pregnant -breast-feeding How should I use this medicine? This medicine is for infusion into a vein. It is given by a health care professional in a hospital or clinic setting. Talk to your pediatrician regarding the use of this medicine in children. Special care may be needed. Overdosage: If you think you have taken too much of this medicine contact a poison control center or emergency room at once. NOTE: This medicine is only for you. Do not share this medicine with others. What if I miss a dose? It is important not to miss your dose. Call your doctor or health care professional if you are unable to keep an appointment. What may interact with this medicine? -certain antibiotics given by injection -NSAIDs, medicines for pain and inflammation, like ibuprofen or naproxen -some diuretics like bumetanide, furosemide -teriparatide -thalidomide This list may not describe all possible interactions. Give your health care provider a list of all the medicines, herbs, non-prescription drugs, or dietary supplements you use. Also tell them if you smoke, drink alcohol, or use illegal drugs. Some items may interact with your medicine. What should I watch for while using this medicine? Visit  your doctor or health care professional for regular checkups. It may be some time before you see the benefit from this medicine. Do not stop taking your medicine unless your doctor tells you to. Your doctor may order blood tests or other tests to see how you are doing. Women should inform their doctor if they wish to become pregnant or think they might be pregnant. There is a potential for serious side effects to an unborn child. Talk to your health care professional or pharmacist for more information. You should make sure that you get enough calcium and vitamin D while you are taking this medicine. Discuss the foods you eat and the vitamins you take with your health care professional. Some people who take this medicine have severe bone, joint, and/or muscle pain. This medicine may also increase your risk for a broken thigh bone. Tell your doctor right away if you have pain in your upper leg or groin. Tell your doctor if you have any pain that does not go away or that gets worse. What side effects may I notice from receiving this medicine? Side effects that you should report to your doctor or health care professional as soon as possible: -allergic reactions like skin rash, itching or hives, swelling of the face, lips, or tongue -anxiety, confusion, or depression -breathing problems -changes in vision -feeling faint or lightheaded, falls -jaw burning, cramping, pain -muscle cramps, stiffness, or weakness -trouble passing urine or change in the amount of urine Side effects that usually do not require medical attention (report to your doctor or health care professional if they continue or are bothersome): -bone, joint, or muscle pain -  fever -hair loss -irritation at site where injected -loss of appetite -nausea, vomiting -stomach upset -tired This list may not describe all possible side effects. Call your doctor for medical advice about side effects. You may report side effects to FDA at  1-800-FDA-1088. Where should I keep my medicine? This drug is given in a hospital or clinic and will not be stored at home. NOTE: This sheet is a summary. It may not cover all possible information. If you have questions about this medicine, talk to your doctor, pharmacist, or health care provider.  2013, [employer]. (06/06/2011 9:06:58 AM)  

## 2013-05-10 NOTE — Progress Notes (Signed)
Diagnosis: Metastatic breast cancer, bone metastasis.  Current therapy: #1 Zometa 4 mg IV every 3 months #2 patient was on Femara 2.5 mg by mouth daily for about one year, and then decided to take herself off of it.  Interim history: Nancy Herring presents today for an office followup visit.  Overall, she reports that she's been doing quite well. Her last bilateral mammogram on 09/16/2012, which revealed benign findings. She is currently off of her Femara 2.5 mg daily.  She reports, that she's actually probably been off of it for over one year.  She was only taking it for one year, and then decided to take herself off of it.  I did go into a detailed explanation of why she should be on the Femara.  However, she feels, that the Zometa is enough for her and she does not need the Femara.  She does report some intermittent lower back pain, mainly, when she stands for long time, but otherwise does not report any other bony pain.  Overall, she is quite active.  She is eating well.  There is no change in bowel or bladder, habits.  She does not report any headaches or vision, changes.  She denies any nausea, vomiting, diarrhea, constipation, chest pain, shortness of breath, cough.  She denies any obvious, bleeding any fevers, chills, or night sweats.  Her last CA 27.29 on 01/25/2013 was 13.  Her last PET scan back in April showed stable osseous lesions with minimal residual metabolic activity.  Overall, Nancy Herring is doing quite well without any complaints.  Review of Systems: Constitutional:Negative for malaise/fatigue, fever, chills, weight loss, diaphoresis, activity change, appetite change, and unexpected weight change.  HEENT: Negative for double vision, blurred vision, visual loss, ear pain, tinnitus, congestion, rhinorrhea, epistaxis sore throat or sinus disease, oral pain/lesion, tongue soreness Respiratory: Negative for cough, chest tightness, shortness of breath, wheezing and stridor.  Cardiovascular:  Negative for chest pain, palpitations, leg swelling, orthopnea, PND, DOE or claudication Gastrointestinal: Negative for nausea, vomiting, abdominal pain, diarrhea, constipation, blood in stool, melena, hematochezia, abdominal distention, anal bleeding, rectal pain, anorexia and hematemesis.  Genitourinary: Negative for dysuria, frequency, hematuria,  Musculoskeletal: Negative for myalgias, back pain, joint swelling, arthralgias and gait problem.  Skin: Negative for rash, color change, pallor and wound.  Neurological:. Negative for dizziness/light-headedness, tremors, seizures, syncope, facial asymmetry, speech difficulty, weakness, numbness, headaches and paresthesias.  Hematological: Negative for adenopathy. Does not bruise/bleed easily.  Psychiatric/Behavioral:  Negative for depression, no loss of interest in normal activity or change in sleep pattern.   Physical Exam: This is a pleasant, 57 year old, African American, female, in no obvious distress Vitals: Temperature 97.9 degrees pulse 70 respirations 16 blood pressure 115/71 weight 181 pounds HEENT reveals a normocephalic, atraumatic skull, no scleral icterus, no oral lesions  Neck is supple without any cervical or supraclavicular adenopathy.  Lungs are clear to auscultation bilaterally. There are no wheezes, rales or rhonci Cardiac is regular rate and rhythm with a normal S1 and S2. There are no murmurs, rubs, or bruits.  Abdomen is soft with good bowel sounds there's a palpable mass. There is no palpable hepatosplenomegaly. There is no palpable fluid wave.  Musculoskeletal no tenderness of the spine, ribs, or hips.  Extremities there are no clubbing, cyanosis, or edema.  Skin no petechia, purpura or ecchymosis Neurologic is nonfocal. Bilateral breast exam: Right breast reveals no palpable masses, erythema, nipple discharge, or retraction.  There is no right axillary adenopathy.  Left breast exam reveals  a well healed lumpectomy scar at the  4:00 position.  There is no palpable masses, erythema, nipple discharge, or retraction.  There is no left axillary adenopathy.  Laboratory Data: White count 4.5 hemoglobin 13.0 hematocrit 39.4 platelets 168,000  Current Outpatient Prescriptions on File Prior to Visit  Medication Sig Dispense Refill  . cholecalciferol (VITAMIN D) 1000 UNITS tablet Take 1,000 Units by mouth daily.      . Multiple Vitamin (MULTI-VITAMIN DAILY PO) Take by mouth every morning.      . Zoledronic Acid (ZOMETA IV) Inject into the vein. Every 3 months       No current facility-administered medications on file prior to visit.   Assessment/Plan: This is a very pleasant, 57 year old, African American, female, with the following issues:  #1.  Metastatic breast cancer-overall, her disease is confined to her bones.  It was her decision to come off of the Femara.  She will remain on Zometa 4 mg every 3 months.  We will continue to monitor her closely.  She has had no evidence of progression as far as I can tell.  #2.  Supportive therapy.  She will continue receiving Zometa 4 mg IV every 3 months.  She doesn't have signs and symptoms of osteonecrosis of the jaw.  #3. followup-Nancy Herring will followup with [place] in 3 months, but before then should there be questions or concerns.

## 2013-05-11 LAB — COMPREHENSIVE METABOLIC PANEL
AST: 18 U/L (ref 0–37)
Albumin: 4.4 g/dL (ref 3.5–5.2)
Alkaline Phosphatase: 125 U/L — ABNORMAL HIGH (ref 39–117)
BUN: 13 mg/dL (ref 6–23)
Glucose, Bld: 108 mg/dL — ABNORMAL HIGH (ref 70–99)
Potassium: 3.9 mEq/L (ref 3.5–5.3)
Total Bilirubin: 0.4 mg/dL (ref 0.3–1.2)

## 2013-05-11 LAB — VITAMIN D 25 HYDROXY (VIT D DEFICIENCY, FRACTURES): Vit D, 25-Hydroxy: 34 ng/mL (ref 30–89)

## 2013-05-13 ENCOUNTER — Telehealth: Payer: Self-pay | Admitting: Oncology

## 2013-05-13 NOTE — Telephone Encounter (Addendum)
Message copied by Lacie Draft on Fri May 13, 2013 11:13 AM ------      Message from: Arlan Organ R      Created: Thu May 12, 2013  5:52 PM       Call and let her know that her labs and vitamin D look good. Thanks. Cindee Lame ------ Spoke with patient regarding labs.

## 2013-08-09 ENCOUNTER — Other Ambulatory Visit (HOSPITAL_BASED_OUTPATIENT_CLINIC_OR_DEPARTMENT_OTHER): Payer: Managed Care, Other (non HMO) | Admitting: Lab

## 2013-08-09 ENCOUNTER — Ambulatory Visit (HOSPITAL_BASED_OUTPATIENT_CLINIC_OR_DEPARTMENT_OTHER): Payer: Managed Care, Other (non HMO) | Admitting: Hematology & Oncology

## 2013-08-09 ENCOUNTER — Ambulatory Visit (HOSPITAL_BASED_OUTPATIENT_CLINIC_OR_DEPARTMENT_OTHER): Payer: Managed Care, Other (non HMO)

## 2013-08-09 VITALS — BP 114/66 | HR 73 | Temp 98.3°F | Resp 16 | Ht 70.0 in | Wt 186.0 lb

## 2013-08-09 DIAGNOSIS — C50919 Malignant neoplasm of unspecified site of unspecified female breast: Secondary | ICD-10-CM

## 2013-08-09 DIAGNOSIS — C7951 Secondary malignant neoplasm of bone: Secondary | ICD-10-CM

## 2013-08-09 DIAGNOSIS — C50519 Malignant neoplasm of lower-outer quadrant of unspecified female breast: Secondary | ICD-10-CM

## 2013-08-09 DIAGNOSIS — M549 Dorsalgia, unspecified: Secondary | ICD-10-CM

## 2013-08-09 LAB — CBC WITH DIFFERENTIAL (CANCER CENTER ONLY)
BASO#: 0 10*3/uL (ref 0.0–0.2)
EOS%: 4.2 % (ref 0.0–7.0)
HGB: 12.8 g/dL (ref 11.6–15.9)
LYMPH#: 1.6 10*3/uL (ref 0.9–3.3)
MCHC: 33.3 g/dL (ref 32.0–36.0)
NEUT#: 2.2 10*3/uL (ref 1.5–6.5)
Platelets: 160 10*3/uL (ref 145–400)
RBC: 4.51 10*6/uL (ref 3.70–5.32)

## 2013-08-09 LAB — CMP (CANCER CENTER ONLY)
AST: 18 U/L (ref 11–38)
Alkaline Phosphatase: 105 U/L — ABNORMAL HIGH (ref 26–84)
BUN, Bld: 11 mg/dL (ref 7–22)
Creat: 0.8 mg/dl (ref 0.6–1.2)
Total Bilirubin: 0.6 mg/dl (ref 0.20–1.60)

## 2013-08-09 MED ORDER — ZOLEDRONIC ACID 4 MG/100ML IV SOLN
4.0000 mg | Freq: Once | INTRAVENOUS | Status: AC
  Administered 2013-08-09: 4 mg via INTRAVENOUS
  Filled 2013-08-09: qty 100

## 2013-08-09 MED ORDER — SODIUM CHLORIDE 0.9 % IV SOLN: 4.0000 mg | Freq: Once | INTRAVENOUS | Status: DC

## 2013-08-09 NOTE — Progress Notes (Signed)
DIAGNOSIS:  Metastatic breast cancer, bone metastasis.  CURRENT THERAPY:  Zometa 4 mg IV q.3 months.  INTERVAL HISTORY:  Nancy Herring comes in for her followup.  She is doing fairly well.  She is still having some back discomfort.  She has seen a chiropractor for this.  We have not done any scans on her as she has been pretty much asymptomatic.  She also has noticed some slight fullness in the left supraclavicular fossa.  There is no pain.  There is no swelling in the left arm.  She has had no cough or shortness of breath.  I think we are going to have to do a CT of the chest to see what is going on there.  She has had no change in bowel or bladder habits.  She has had no leg swelling.  She has had a good summer so far.  She has been taking care of her grandkids.  Of note, her last tumor marker was done back in May.  Her CA 27.29 was 18.  PHYSICAL EXAMINATION:  General:  This is a well-developed, well- nourished African American female in no obvious distress.  Vital signs: Temperature of 98.3, pulse 73, respiratory rate 16, blood pressure 114/66.  Weight is 186.  Head and neck:  Normocephalic, atraumatic skull.  There are no ocular or oral lesions.  There are no palpable cervical or supraclavicular lymph nodes.  She has some slight fullness in the left supraclavicular fossa.  Lungs:  Clear bilaterally.  Cardiac: Regular rate and rhythm with a normal S1 and S2.  There are no murmurs, rubs or bruits.  Abdomen:  Soft.  She has good bowel sounds.  There is no fluid wave.  There is no palpable hepatosplenomegaly.  Extremities: No clubbing, cyanosis or edema.  She has good range motion of her joints.  LABORATORY STUDIES:  White cell count is 4.3, hemoglobin 12.8, hematocrit 38.4, platelet count 160.  IMPRESSION:  Nancy Herring is a very charming 57 year old African American female with metastatic breast cancer.  She has bony metastases.  We had her on Femara but Ms. Shamoon  decided not to take this herself.  We have been following her now for, I think 3 or 4 years.  She has done well.  I am not sure what this issue is with the left supraclavicular fossa.  I will get a CT scan of the chest to see if we cannot see anything.  I will plan to get her back in 3 months if all looks good.    ______________________________ Josph Macho, M.D. PRE/MEDQ  D:  08/09/2013  T:  08/09/2013  Job:  [id number]

## 2013-08-09 NOTE — Patient Instructions (Signed)
Zoledronic Acid injection (Hypercalcemia, Oncology) What is this medicine? ZOLEDRONIC ACID (ZOE le dron ik AS id) lowers the amount of calcium loss from bone. It is used to treat too much calcium in your blood from cancer. It is also used to prevent complications of cancer that has spread to the bone. This medicine may be used for other purposes; ask your health care provider or pharmacist if you have questions. What should I tell my health care provider before I take this medicine? They need to know if you have any of these conditions: -aspirin-sensitive asthma -dental disease -kidney disease -an unusual or allergic reaction to zoledronic acid, other medicines, foods, dyes, or preservatives -pregnant or trying to get pregnant -breast-feeding How should I use this medicine? This medicine is for infusion into a vein. It is given by a health care professional in a hospital or clinic setting. Talk to your pediatrician regarding the use of this medicine in children. Special care may be needed. Overdosage: If you think you have taken too much of this medicine contact a poison control center or emergency room at once. NOTE: This medicine is only for you. Do not share this medicine with others. What if I miss a dose? It is important not to miss your dose. Call your doctor or health care professional if you are unable to keep an appointment. What may interact with this medicine? -certain antibiotics given by injection -NSAIDs, medicines for pain and inflammation, like ibuprofen or naproxen -some diuretics like bumetanide, furosemide -teriparatide -thalidomide This list may not describe all possible interactions. Give your health care provider a list of all the medicines, herbs, non-prescription drugs, or dietary supplements you use. Also tell them if you smoke, drink alcohol, or use illegal drugs. Some items may interact with your medicine. What should I watch for while using this medicine? Visit  your doctor or health care professional for regular checkups. It may be some time before you see the benefit from this medicine. Do not stop taking your medicine unless your doctor tells you to. Your doctor may order blood tests or other tests to see how you are doing. Women should inform their doctor if they wish to become pregnant or think they might be pregnant. There is a potential for serious side effects to an unborn child. Talk to your health care professional or pharmacist for more information. You should make sure that you get enough calcium and vitamin D while you are taking this medicine. Discuss the foods you eat and the vitamins you take with your health care professional. Some people who take this medicine have severe bone, joint, and/or muscle pain. This medicine may also increase your risk for a broken thigh bone. Tell your doctor right away if you have pain in your upper leg or groin. Tell your doctor if you have any pain that does not go away or that gets worse. What side effects may I notice from receiving this medicine? Side effects that you should report to your doctor or health care professional as soon as possible: -allergic reactions like skin rash, itching or hives, swelling of the face, lips, or tongue -anxiety, confusion, or depression -breathing problems -changes in vision -feeling faint or lightheaded, falls -jaw burning, cramping, pain -muscle cramps, stiffness, or weakness -trouble passing urine or change in the amount of urine Side effects that usually do not require medical attention (report to your doctor or health care professional if they continue or are bothersome): -bone, joint, or muscle pain -  fever -hair loss -irritation at site where injected -loss of appetite -nausea, vomiting -stomach upset -tired This list may not describe all possible side effects. Call your doctor for medical advice about side effects. You may report side effects to FDA at  1-800-FDA-1088. Where should I keep my medicine? This drug is given in a hospital or clinic and will not be stored at home. NOTE: This sheet is a summary. It may not cover all possible information. If you have questions about this medicine, talk to your doctor, pharmacist, or health care provider.  2013, [employer]. (06/06/2011 9:06:58 AM)  

## 2013-08-09 NOTE — Progress Notes (Signed)
This office note has been dictated.

## 2013-08-10 LAB — VITAMIN D 25 HYDROXY (VIT D DEFICIENCY, FRACTURES): Vit D, 25-Hydroxy: 41 ng/mL (ref 30–89)

## 2013-08-10 LAB — CANCER ANTIGEN 27.29: CA 27.29: 17 U/mL (ref 0–39)

## 2013-08-17 ENCOUNTER — Ambulatory Visit (HOSPITAL_COMMUNITY)
Admission: RE | Admit: 2013-08-17 | Discharge: 2013-08-17 | Disposition: A | Discharge status: Home / Self Care | Payer: Managed Care, Other (non HMO) | Source: Ambulatory Visit | Attending: Hematology & Oncology | Admitting: Hematology & Oncology

## 2013-08-17 DIAGNOSIS — C7951 Secondary malignant neoplasm of bone: Secondary | ICD-10-CM | POA: Insufficient documentation

## 2013-08-17 DIAGNOSIS — C50919 Malignant neoplasm of unspecified site of unspecified female breast: Secondary | ICD-10-CM | POA: Insufficient documentation

## 2013-08-17 MED ORDER — IOHEXOL 300 MG/ML  SOLN
100.0000 mL | Freq: Once | INTRAMUSCULAR | Status: AC | PRN
  Administered 2013-08-17: 100 mL via INTRAVENOUS

## 2013-08-18 ENCOUNTER — Encounter: Payer: Self-pay | Admitting: *Deleted

## 2013-10-03 ENCOUNTER — Other Ambulatory Visit: Payer: Self-pay | Admitting: Hematology & Oncology

## 2013-10-03 DIAGNOSIS — Z853 Personal history of malignant neoplasm of breast: Secondary | ICD-10-CM

## 2013-10-14 ENCOUNTER — Ambulatory Visit
Admission: RE | Admit: 2013-10-14 | Discharge: 2013-10-14 | Disposition: A | Discharge status: Home / Self Care | Payer: Managed Care, Other (non HMO) | Source: Ambulatory Visit | Attending: Hematology & Oncology | Admitting: Hematology & Oncology

## 2013-10-14 DIAGNOSIS — Z853 Personal history of malignant neoplasm of breast: Secondary | ICD-10-CM

## 2013-10-27 ENCOUNTER — Other Ambulatory Visit: Payer: Self-pay

## 2013-11-08 ENCOUNTER — Other Ambulatory Visit (HOSPITAL_BASED_OUTPATIENT_CLINIC_OR_DEPARTMENT_OTHER): Payer: Managed Care, Other (non HMO) | Admitting: Lab

## 2013-11-08 ENCOUNTER — Ambulatory Visit (HOSPITAL_BASED_OUTPATIENT_CLINIC_OR_DEPARTMENT_OTHER): Payer: Managed Care, Other (non HMO)

## 2013-11-08 ENCOUNTER — Ambulatory Visit (HOSPITAL_BASED_OUTPATIENT_CLINIC_OR_DEPARTMENT_OTHER): Payer: Managed Care, Other (non HMO) | Admitting: Hematology & Oncology

## 2013-11-08 VITALS — BP 128/73 | HR 82 | Temp 98.2°F | Resp 14 | Ht 69.0 in | Wt 187.0 lb

## 2013-11-08 DIAGNOSIS — C7951 Secondary malignant neoplasm of bone: Secondary | ICD-10-CM

## 2013-11-08 DIAGNOSIS — C50919 Malignant neoplasm of unspecified site of unspecified female breast: Secondary | ICD-10-CM

## 2013-11-08 DIAGNOSIS — C50519 Malignant neoplasm of lower-outer quadrant of unspecified female breast: Secondary | ICD-10-CM

## 2013-11-08 LAB — CBC WITH DIFFERENTIAL (CANCER CENTER ONLY)
BASO%: 0.2 % (ref 0.0–2.0)
Eosinophils Absolute: 0.2 10*3/uL (ref 0.0–0.5)
HGB: 12.6 g/dL (ref 11.6–15.9)
MCV: 85 fL (ref 81–101)
MONO#: 0.4 10*3/uL (ref 0.1–0.9)
MONO%: 7.3 % (ref 0.0–13.0)
NEUT#: 2.4 10*3/uL (ref 1.5–6.5)
Platelets: 160 10*3/uL (ref 145–400)
RBC: 4.57 10*6/uL (ref 3.70–5.32)
RDW: 12.4 % (ref 11.1–15.7)
WBC: 4.8 10*3/uL (ref 3.9–10.0)

## 2013-11-08 LAB — CMP (CANCER CENTER ONLY)
ALT(SGPT): 25 U/L (ref 10–47)
AST: 24 U/L (ref 11–38)
Albumin: 3.7 g/dL (ref 3.3–5.5)
Alkaline Phosphatase: 124 U/L — ABNORMAL HIGH (ref 26–84)
CO2: 29 mEq/L (ref 18–33)
Creat: 1.4 mg/dl — ABNORMAL HIGH (ref 0.6–1.2)
Glucose, Bld: 92 mg/dL (ref 73–118)
Potassium: 3.9 mEq/L (ref 3.3–4.7)
Sodium: 142 mEq/L (ref 128–145)
Total Protein: 8.1 g/dL (ref 6.4–8.1)

## 2013-11-08 MED ORDER — SODIUM CHLORIDE 0.9 % IV SOLN
250.0000 mL | Freq: Once | INTRAVENOUS | Status: AC
  Administered 2013-11-08: 11:00:00 via INTRAVENOUS

## 2013-11-08 MED ORDER — ZOLEDRONIC ACID 4 MG/100ML IV SOLN
4.0000 mg | Freq: Once | INTRAVENOUS | Status: AC
  Administered 2013-11-08: 4 mg via INTRAVENOUS
  Filled 2013-11-08: qty 100

## 2013-11-08 NOTE — Progress Notes (Signed)
This office note has been dictated.

## 2013-11-08 NOTE — Patient Instructions (Signed)

## 2013-11-09 LAB — CANCER ANTIGEN 27.29: CA 27.29: 15 U/mL (ref 0–39)

## 2013-11-12 NOTE — Progress Notes (Signed)
CC:   Danise Edge, MD  DIAGNOSIS:  Metastatic breast cancer - bone metastasis.  CURRENT THERAPY:  Zometa 4 mg IV q.3 months.  INTERIM HISTORY:  Ms. Nancy Herring comes in for followup.  She is doing fairly well.  When I saw her, we did do a CT scan of the chest.  There is some fullness in the left supraclavicular region.  Thankfully, the CT scan did not show any evidence of adenopathy in the supraclavicular region. Lungs looked fine.  There was no mediastinal disease.  She had a mammogram done back in October of this year.  Mammogram looks fine with no evidence of suspicious microcalcifications.  She is getting ready go to [place] for Thanksgiving.  She typically goes to [place] over the holidays.  When I saw her in August, her CA27.29 was 17.  She said her back is not bothering her as much.  I think she still sees a chiropractor for this.  She has had no cough.  She has had no fever.  She has had no headache. There has been no change in bowel or bladder habits.  PHYSICAL EXAMINATION:  General:  This is a well-developed, well- nourished [place] female, in no obvious distress.  Vital Signs: Temperature of 98.2, pulse 82, respiratory rate 14, blood pressure 128/73, weight is 187 pounds.  Head and neck:  Showed the normocephalic, atraumatic skull.  There are no ocular or oral lesions.  There are no palpable cervical or supraclavicular lymph nodes.  Lungs:  Clear bilaterally.  Cardiac:  Regular rate and rhythm with a normal S1, S2. There are no murmurs, rubs, or bruits.  Breasts:  Show right breast with no masses, edema, or erythema.  There is no right axillary adenopathy. Left breast did show some contraction and hyperpigmentation from radiation.  There is a well-healed lumpectomy scar at about the 4 o'clock position.  No distinct mass noted in the left breast.  There is no left axillary adenopathy.  Abdomen:  Soft.  She has good bowel sounds.  There is no fluid  wave.  There is no palpable hepatosplenomegaly.  Back:  No tenderness of the spine, ribs, or hips. Extremities:  Show no clubbing, cyanosis, or edema.  She has good strength in her legs.  She has good range motion of her joints.  Skin: No rashes, ecchymosis, or petechia.  LABORATORY STUDIES:  White blood cell count 4.8, hemoglobin 12.6, hematocrit 38.7, platelet count 160.  IMPRESSION:  Ms. Tant is a very charming 57 year old African American female.  She has metastatic breast cancer, but she has oligometastatic disease.  We did have her on Femara, but again, she declined this.  For now, I do not see anything that we need to worry about with respect to metastatic disease.  I do not think we need to do any scans on her.  We will continue on the Zometa every 3 months.  We will plan to get her back in 3 more months.  I think that we might be able to consider every 4 months for the Zometa if she remains stable.    ______________________________ Josph Macho, M.D. PRE/MEDQ  D:  11/08/2013  T:  11/12/2013  Job:  [id number]

## 2014-02-07 ENCOUNTER — Other Ambulatory Visit: Payer: Managed Care, Other (non HMO) | Admitting: Lab

## 2014-02-07 ENCOUNTER — Ambulatory Visit: Payer: Managed Care, Other (non HMO)

## 2014-02-07 ENCOUNTER — Ambulatory Visit: Payer: Managed Care, Other (non HMO) | Admitting: Hematology & Oncology

## 2014-03-21 ENCOUNTER — Telehealth: Payer: Self-pay | Admitting: Hematology & Oncology

## 2014-03-21 NOTE — Telephone Encounter (Signed)
Patient called and cx 03/22/14 apt.  She stated she will call back to resch

## 2014-03-22 ENCOUNTER — Ambulatory Visit: Payer: Managed Care, Other (non HMO)

## 2014-03-22 ENCOUNTER — Ambulatory Visit: Payer: Managed Care, Other (non HMO) | Admitting: Hematology & Oncology

## 2014-03-22 ENCOUNTER — Other Ambulatory Visit: Payer: Managed Care, Other (non HMO) | Admitting: Lab

## 2014-05-31 ENCOUNTER — Ambulatory Visit (INDEPENDENT_AMBULATORY_CARE_PROVIDER_SITE_OTHER): Payer: 59 | Admitting: Physician Assistant

## 2014-05-31 ENCOUNTER — Encounter: Payer: Self-pay | Admitting: Physician Assistant

## 2014-05-31 VITALS — BP 152/100 | HR 77 | Temp 98.9°F | Resp 16 | Ht 70.0 in | Wt 186.2 lb

## 2014-05-31 DIAGNOSIS — R011 Cardiac murmur, unspecified: Secondary | ICD-10-CM

## 2014-05-31 DIAGNOSIS — M62838 Other muscle spasm: Secondary | ICD-10-CM

## 2014-05-31 DIAGNOSIS — K5900 Constipation, unspecified: Secondary | ICD-10-CM

## 2014-05-31 DIAGNOSIS — J329 Chronic sinusitis, unspecified: Secondary | ICD-10-CM

## 2014-05-31 MED ORDER — AMOXICILLIN 875 MG PO TABS: 875.0000 mg | ORAL_TABLET | Freq: Two times a day (BID) | ORAL | Status: DC

## 2014-05-31 MED ORDER — CYCLOBENZAPRINE HCL 5 MG PO TABS: 5.0000 mg | ORAL_TABLET | Freq: Three times a day (TID) | ORAL | Status: DC | PRN

## 2014-05-31 NOTE — Progress Notes (Signed)
Pre visit review using our clinic review tool, if applicable. No additional management support is needed unless otherwise documented below in the visit note/SLS  

## 2014-05-31 NOTE — Patient Instructions (Addendum)
For sinus pain -- Increase fluid intake.  Rest.  Take antibiotic as directed with food.  Use saline nasal spray. Place a humidifier in the bedroom.    For Shoulder pain -- Alternate tylenol and ibuprofen.  Avoid heavy lifting or overexertion.  Take Flexeril at bedtime.  Apply topical Aspercreme to affected area.  Place a heating pad on shoulder for 15 minutes, a couple of times per day.  If symptoms do not begin to improve, we will have to obtain imaging.   For Belching -- Avoid carbonated sodas.  Take a daily probiotic (Culturelle, Align, Digestive Advantage).  Avoid heavy and spicy foods.  Take a pepcid if you notice acid reflux.   For Murmur -- Your EKG is normal.  I am setting you up for an echocardiogram to further assess the murmur.  If there are abnormal findings, you will be set up with a Cardiologist.  Your blood pressure is elevated today.  This could be due to feeling bad secondary to sinus infection.  However, there is a murmur on exam.  I would like for you to follow-up in 2 weeks here so we can reassess your blood pressure.  Please select a Primary Care Provider.  I know you had mentioned wanting to see Dr. Shawna Orleans again.  He is currently at our [facility] office.

## 2014-06-04 DIAGNOSIS — R011 Cardiac murmur, unspecified: Secondary | ICD-10-CM | POA: Insufficient documentation

## 2014-06-04 DIAGNOSIS — M62838 Other muscle spasm: Secondary | ICD-10-CM | POA: Insufficient documentation

## 2014-06-04 DIAGNOSIS — J329 Chronic sinusitis, unspecified: Secondary | ICD-10-CM | POA: Insufficient documentation

## 2014-06-04 NOTE — Assessment & Plan Note (Signed)
Alternate tylenol and Ibuprofen as needed.  Rx Flexeril. Topical Aspercreme. Heating pad. Avoid heavy lifting or overexertion.  Call if symptoms not improving as this will warrant imaging and possible referral to Sports Medicine.

## 2014-06-04 NOTE — Assessment & Plan Note (Signed)
Rx amoxicillin.  Increase fluids. Rest.  Saline nasal spray. Probiotic.  Humidifier in bedroom.  Mucinex as directed.

## 2014-06-04 NOTE — Progress Notes (Signed)
Patient presents to clinic today with multiple complaints.  Patient has not been seen in this clinic since 2013.\  Patient c/o right shoulder and neck pain that feels like a "pulling sensation".  Pain has been present x 2 weeks.  Is not worsening, but is not improving.  Patient denies trauma or injury. Denies decreased ROM, numbness or tingling of affected extremity.  Denies pallor, cyanosis or coolness of extremity.  Patient c/o increased belching over the past few weeks.  Denies heartburn or reflux.  Denies epigastric pain.  Denies increased flatulence.  Endorses some constipation. Denies tenesmus, melena or hematochezia.  Denies change to diet. Last bowel movement this morning.  Patient complains of left-sided sinus pressure and pain with headache and nasal congestion. Endorses thick rhinorrhea.  Endorses severe tooth/gum pain x 1-2 days.  Denies fever, cough, SOB or wheezing.  Denies ear pain.   Past Medical History  Diagnosis Date  . Cancer     Current Outpatient Prescriptions on File Prior to Visit  Medication Sig Dispense Refill  . Biotin 2.5 MG CAPS Take by mouth every morning.      . cholecalciferol (VITAMIN D) 1000 UNITS tablet Take 1,000 Units by mouth daily.      . Multiple Vitamin (MULTI-VITAMIN DAILY PO) Take by mouth every morning.       No current facility-administered medications on file prior to visit.    No Known Allergies  Family History  Problem Relation Age of Onset  . Heart disease Neg Hx   . Colon cancer Neg Hx   . Breast cancer Neg Hx   . Diabetes Father     brother  . Prostate cancer Neg Hx   . Hypertension Brother     History   Social History  . Marital Status: Married    Spouse Name: N/A    Number of Children: N/A  . Years of Education: N/A   Social History Main Topics  . Smoking status: Never Smoker   . Smokeless tobacco: Never Used  . Alcohol Use: No  . Drug Use: No  . Sexual Activity: None   Other Topics Concern  . None   Social  History Narrative  . None   Review of Systems - See HPI.  All other ROS are negative.  BP 152/100  Pulse 77  Temp(Src) 98.9 F (37.2 C) (Oral)  Resp 16  Ht 5\' 10"  (1.778 m)  Wt 186 lb 4 oz (84.482 kg)  BMI 26.72 kg/m2  SpO2 99%  Physical Exam  Vitals reviewed. Constitutional: She is oriented to person, place, and time and well-developed, well-nourished, and in no distress.  HENT:  Head: Normocephalic and atraumatic.  Right Ear: Tympanic membrane, external ear and ear canal normal.  Left Ear: Tympanic membrane, external ear and ear canal normal.  Nose: Rhinorrhea present. Right sinus exhibits no maxillary sinus tenderness and no frontal sinus tenderness. Left sinus exhibits maxillary sinus tenderness and frontal sinus tenderness.  Mouth/Throat: Uvula is midline, oropharynx is clear and moist and mucous membranes are normal.  Eyes: Conjunctivae are normal. Pupils are equal, round, and reactive to light.  Neck: Neck supple.  Cardiovascular: Normal rate, regular rhythm, S1 normal and S2 normal.  Exam reveals no S3 and no S4.   Murmur heard.  Systolic murmur is present with a grade of 2/6  Pulses:      Carotid pulses are 2+ on the right side, and 2+ on the left side.      Radial pulses are  2+ on the right side, and 2+ on the left side.  Pulmonary/Chest: Effort normal and breath sounds normal. No respiratory distress. She has no wheezes. She has no rales. She exhibits no tenderness.  Abdominal: Soft. Bowel sounds are normal. She exhibits no distension and no mass. There is no tenderness. There is no rebound and no guarding.  Musculoskeletal:       Right shoulder: She exhibits tenderness, pain and spasm. She exhibits normal range of motion, no swelling, no deformity, normal pulse and normal strength.       Right elbow: Normal.      Right wrist: Normal.       Cervical back: She exhibits tenderness, pain and spasm. She exhibits normal range of motion and no bony tenderness.       Right  upper arm: Normal.       Right forearm: Normal.       Right hand: Normal. She exhibits normal capillary refill. Normal sensation noted. Normal strength noted.  Lymphadenopathy:    She has no cervical adenopathy.  Neurological: She is alert and oriented to person, place, and time.  Skin: Skin is warm. No rash noted.  Psychiatric: Affect normal.   Assessment/Plan: CONSTIPATION With increased belching.  Start probiotic and fiber supplement. Increase fluid intake. Stop intake of carbonated beverages.  Pepcid for heartburn. Avoid late-night eating. Increase aerobic activity as this promotes intestinal motility. Follow-up if no improvement in 1-2 weeks.  Sinusitis Rx amoxicillin.  Increase fluids. Rest.  Saline nasal spray. Probiotic.  Humidifier in bedroom.  Mucinex as directed.  Muscle spasm of right shoulder Alternate tylenol and Ibuprofen as needed.  Rx Flexeril. Topical Aspercreme. Heating pad. Avoid heavy lifting or overexertion.  Call if symptoms not improving as this will warrant imaging and possible referral to Sports Medicine.  Undiagnosed cardiac murmurs II/VI SEM.  Asymptomatic.  EKG obtained revealing NSR at rate of 78.  Will place order for echocardiogram to further assess.

## 2014-06-04 NOTE — Assessment & Plan Note (Signed)
With increased belching.  Start probiotic and fiber supplement. Increase fluid intake. Stop intake of carbonated beverages.  Pepcid for heartburn. Avoid late-night eating. Increase aerobic activity as this promotes intestinal motility. Follow-up if no improvement in 1-2 weeks.

## 2014-06-04 NOTE — Assessment & Plan Note (Signed)
II/VI SEM.  Asymptomatic.  EKG obtained revealing NSR at rate of 78.  Will place order for echocardiogram to further assess.

## 2014-06-14 ENCOUNTER — Encounter: Payer: Self-pay | Admitting: Physician Assistant

## 2014-06-14 ENCOUNTER — Ambulatory Visit (INDEPENDENT_AMBULATORY_CARE_PROVIDER_SITE_OTHER): Payer: 59 | Admitting: Physician Assistant

## 2014-06-14 VITALS — BP 118/72 | HR 80 | Temp 98.7°F | Resp 16 | Ht 70.0 in | Wt 186.5 lb

## 2014-06-14 DIAGNOSIS — K219 Gastro-esophageal reflux disease without esophagitis: Secondary | ICD-10-CM

## 2014-06-14 NOTE — Assessment & Plan Note (Signed)
Resolved with dietary changes and occasional TUMS. Continue care as discussed.  Follow-up only if needed.

## 2014-06-14 NOTE — Progress Notes (Signed)
Pre visit review using our clinic review tool, if applicable. No additional management support is needed unless otherwise documented below in the visit note/SLS  

## 2014-06-14 NOTE — Progress Notes (Signed)
Patient presents to clinic today for follow-up of GERD.  Patient has been avoiding carbonated beverages.  Endorses taking Pepcid for a few days.  No late-night eating.  Endorses resolution of symptoms.  Denies reflux, nausea/vomiting, stomach pain or belching.  LBM this morning without abnormalities.   Past Medical History  Diagnosis Date  . Cancer     Current Outpatient Prescriptions on File Prior to Visit  Medication Sig Dispense Refill  . Biotin 2.5 MG CAPS Take by mouth every morning.      . cholecalciferol (VITAMIN D) 1000 UNITS tablet Take 1,000 Units by mouth daily.      . Cyanocobalamin (VITAMIN B-12) 1000 MCG SUBL Place under the tongue daily.      . cyclobenzaprine (FLEXERIL) 5 MG tablet Take 1 tablet (5 mg total) by mouth 3 (three) times daily as needed for muscle spasms.  30 tablet  1  . Multiple Vitamin (MULTI-VITAMIN DAILY PO) Take by mouth every morning.       No current facility-administered medications on file prior to visit.    No Known Allergies  Family History  Problem Relation Age of Onset  . Heart disease Neg Hx   . Colon cancer Neg Hx   . Breast cancer Neg Hx   . Diabetes Father     brother  . Prostate cancer Neg Hx   . Hypertension Brother     History   Social History  . Marital Status: Married    Spouse Name: N/A    Number of Children: N/A  . Years of Education: N/A   Social History Main Topics  . Smoking status: Never Smoker   . Smokeless tobacco: Never Used  . Alcohol Use: No  . Drug Use: No  . Sexual Activity: None   Other Topics Concern  . None   Social History Narrative  . None   Review of Systems - See HPI.  All other ROS are negative.  BP 118/72  Pulse 80  Temp(Src) 98.7 F (37.1 C) (Oral)  Resp 16  Ht 5\' 10"  (1.778 m)  Wt 186 lb 8 oz (84.596 kg)  BMI 26.76 kg/m2  SpO2 98%  Physical Exam  Vitals reviewed. Constitutional: She is well-developed, well-nourished, and in no distress.  HENT:  Head: Normocephalic and  atraumatic.  Cardiovascular: Normal rate, regular rhythm, normal heart sounds and intact distal pulses.   Pulmonary/Chest: Effort normal and breath sounds normal. No respiratory distress. She has no wheezes. She has no rales. She exhibits no tenderness.  Abdominal: Soft. Bowel sounds are normal. She exhibits no distension and no mass. There is no tenderness. There is no rebound and no guarding.  Skin: Skin is warm and dry. No rash noted.  Psychiatric: Affect normal.   Assessment/Plan: Acid reflux Resolved with dietary changes and occasional TUMS. Continue care as discussed.  Follow-up only if needed.

## 2014-06-14 NOTE — Patient Instructions (Signed)
Please continue TUMS as needed. Avoid late-night eating. Elevate the head of your bed.  I am glad you are doing better.  Let me know if symptoms recur or worsen.  No follow-up is needed unless you have worsening symptoms.   I will have the referral coordinator check on the Echo for you.

## 2014-06-22 ENCOUNTER — Ambulatory Visit (HOSPITAL_COMMUNITY): Payer: 59 | Attending: Physician Assistant | Admitting: Radiology

## 2014-06-22 DIAGNOSIS — R011 Cardiac murmur, unspecified: Secondary | ICD-10-CM | POA: Insufficient documentation

## 2014-06-22 DIAGNOSIS — I10 Essential (primary) hypertension: Secondary | ICD-10-CM | POA: Insufficient documentation

## 2014-06-22 DIAGNOSIS — I517 Cardiomegaly: Secondary | ICD-10-CM | POA: Insufficient documentation

## 2014-06-22 NOTE — Progress Notes (Signed)
Echocardiogram performed.  

## 2014-06-30 ENCOUNTER — Encounter: Payer: Self-pay | Admitting: *Deleted

## 2014-09-28 ENCOUNTER — Ambulatory Visit: Payer: 59 | Admitting: Family

## 2014-10-20 ENCOUNTER — Other Ambulatory Visit: Payer: Self-pay

## 2014-10-20 ENCOUNTER — Other Ambulatory Visit: Payer: Self-pay | Admitting: Hematology & Oncology

## 2014-10-20 DIAGNOSIS — Z853 Personal history of malignant neoplasm of breast: Secondary | ICD-10-CM

## 2014-11-02 ENCOUNTER — Ambulatory Visit
Admission: RE | Admit: 2014-11-02 | Discharge: 2014-11-02 | Disposition: A | Discharge status: Home / Self Care | Payer: 59 | Source: Ambulatory Visit | Attending: Hematology & Oncology | Admitting: Hematology & Oncology

## 2014-11-02 DIAGNOSIS — Z853 Personal history of malignant neoplasm of breast: Secondary | ICD-10-CM

## 2015-01-24 ENCOUNTER — Other Ambulatory Visit: Payer: Self-pay | Admitting: Family

## 2015-07-27 ENCOUNTER — Encounter: Payer: Self-pay | Admitting: Internal Medicine

## 2015-07-27 ENCOUNTER — Ambulatory Visit (INDEPENDENT_AMBULATORY_CARE_PROVIDER_SITE_OTHER): Payer: 59 | Admitting: Internal Medicine

## 2015-07-27 VITALS — BP 102/72 | HR 76 | Temp 98.3°F | Ht 68.0 in | Wt 182.7 lb

## 2015-07-27 DIAGNOSIS — C50912 Malignant neoplasm of unspecified site of left female breast: Secondary | ICD-10-CM | POA: Diagnosis not present

## 2015-07-27 NOTE — Patient Instructions (Signed)
Contact our office if your hoarseness does not improve Please forward a copy of your blood work

## 2015-07-27 NOTE — Progress Notes (Signed)
Subjective:    Patient ID: Nancy Herring, female    DOB: 1956-04-18, 59 y.o.   MRN: [id number]  HPI  59 year old African-American female with history of metastatic breast cancer, GERD and intermittent low back pain to establish.  Patient reports she was diagnosed with breast cancer in 2007 in her left breast. She underwent lumpectomy in 2007 and then underwent additional surgery in 2009. Patient previously followed by Dr. Marin Olp.  Patient reports she was not treated with radiation or chemotherapy. She was receiving Zometa IV every 3 months until 2014.  Patient also on Femara in the past but she discontinued on her own.  Patient complains of chronic intermittent right lower flank/rib pain. She has had similar discomfort in the past. It was x-rayed 2 years ago. She is also concerned of mild fullness in her left supraclavicular area. It has been evaluated in the past and felt to be increased adipose tissue.  Patient sings in a choir. She complains of occasional hoarseness. Her symptoms started after an upper respiratory infection several months ago. She has occasional reflux symptoms for which she takes over-the-counter times. She also complains of intermittent postnasal drip.   Review of Systems  Constitutional: Negative for activity change, appetite change and unexpected weight change.  Eyes: Negative for visual disturbance.  Respiratory: Negative for cough, chest tightness and shortness of breath.   Cardiovascular: Negative for chest pain.  Genitourinary: Negative for difficulty urinating.  Neurological: Negative for headaches.  Gastrointestinal: Negative for abdominal pain, heartburn melena or hematochezia Psych: Negative for depression or anxiety Musculoskeletal:  Negative for joint pain        Past Medical History  Diagnosis Date  . Breast cancer     Metastatic to bone - followed by Dr. Marin Olp    History   Social History  . Marital Status: Married    Spouse Name: N/A   . Number of Children: N/A  . Years of Education: N/A   Occupational History  . Not on file.   Social History Main Topics  . Smoking status: Never Smoker   . Smokeless tobacco: Never Used  . Alcohol Use: No  . Drug Use: No  . Sexual Activity: Not on file   Other Topics Concern  . Not on file   Social History Narrative   Sister - Paul Half    Past Surgical History  Procedure Laterality Date  . Breast lumpectomy  2007 & 2009    Family History  Problem Relation Age of Onset  . Heart disease Neg Hx   . Colon cancer Neg Hx   . Breast cancer Neg Hx   . Diabetes Father     brother  . Prostate cancer Neg Hx   . Hypertension Brother   . Crohn's disease Sister     No Known Allergies  Current Outpatient Prescriptions on File Prior to Visit  Medication Sig Dispense Refill  . Biotin 2.5 MG CAPS Take by mouth every morning.    . cholecalciferol (VITAMIN D) 1000 UNITS tablet Take 2,000 Units by mouth daily.     . Cyanocobalamin (VITAMIN B-12) 1000 MCG SUBL Place under the tongue daily.    . Multiple Vitamin (MULTI-VITAMIN DAILY PO) Take by mouth every morning.     No current facility-administered medications on file prior to visit.    BP 102/72 mmHg  Pulse 76  Temp(Src) 98.3 F (36.8 C) (Oral)  Ht 5\' 8"  (1.727 m)  Wt 182 lb 11.2 oz (82.872 kg)  BMI  27.79 kg/m2    Objective:   Physical Exam   Constitutional: Appears well-developed and well-nourished. No distress.  Head: Normocephalic and atraumatic.  Ear:  Right and left ear normal.  TMs clear.  Hearing is grossly normal Mouth/Throat: Oropharynx is clear and moist.  Eyes: Conjunctivae are normal. Pupils are equal, round, and reactive to light.  Neck: Normal range of motion. Neck supple. No thyromegaly present. No carotid bruit.  Negative for supraclavicular adenopathy.  Slight fullness on left side-question increased adipose tissue versus prominent first and second rib Cardiovascular: Normal rate, regular  rhythm and normal heart sounds.  Exam reveals no gallop and no friction rub.  No murmur heard. Pulmonary/Chest: Effort normal and breath sounds normal.  No wheezes. No rales. Mild tenderness to right lower ribs. Abdominal: Soft. Bowel sounds are normal. No mass. There is no tenderness.  Neurological: Alert. No cranial nerve deficit.  Skin: Skin is warm and dry.  Psychiatric: Normal mood and affect. Behavior is normal.       Assessment & Plan:   1. Metastatic breast cancer (Oligometastatic to bone) 2. Chronic right lower rib pain 3. Intermittent hoarseness 4. History of GERD  Patient advised to reestablish with her oncologist. Arrange consultation with Dr. Marin Olp.  I will defer further evaluation of right lower rib pain to Dr. Marin Olp.  She may need repeat xrays and / or bone scan.  We discussed possibility that her chronic intermittent hoarseness may be secondary to silent reflux versus postnasal drip. Patient will try raising head of bed 6-8 inches and using over-the-counter intranasal saline.

## 2015-07-27 NOTE — Progress Notes (Signed)
Pre visit review using our clinic review tool, if applicable. No additional management support is needed unless otherwise documented below in the visit note. 

## 2015-07-30 ENCOUNTER — Telehealth: Payer: Self-pay | Admitting: Hematology & Oncology

## 2015-07-30 NOTE — Telephone Encounter (Signed)
Lt mess for future appt 8/24 at 9:30 due to referral from Dr. Shawna Orleans [facility])

## 2015-07-30 NOTE — Telephone Encounter (Signed)
Spoke with referring office Pleas Koch) regarding recent labs and/or  imaging to support dx

## 2015-07-30 NOTE — Telephone Encounter (Signed)
Lt mess regarding scheduling a future appt soon

## 2015-08-07 ENCOUNTER — Telehealth: Payer: Self-pay | Admitting: Hematology & Oncology

## 2015-08-07 NOTE — Telephone Encounter (Signed)
Pt called to confirm appt

## 2015-08-13 ENCOUNTER — Telehealth: Payer: Self-pay | Admitting: Hematology & Oncology

## 2015-08-13 NOTE — Telephone Encounter (Signed)
Contacted referring office regarding labs and imaging to support dx.  Lt mess for referral rep

## 2015-08-14 ENCOUNTER — Other Ambulatory Visit: Payer: Self-pay | Admitting: *Deleted

## 2015-08-14 DIAGNOSIS — C50912 Malignant neoplasm of unspecified site of left female breast: Secondary | ICD-10-CM

## 2015-08-15 ENCOUNTER — Other Ambulatory Visit (HOSPITAL_BASED_OUTPATIENT_CLINIC_OR_DEPARTMENT_OTHER): Payer: 59

## 2015-08-15 ENCOUNTER — Encounter: Payer: Self-pay | Admitting: Hematology & Oncology

## 2015-08-15 ENCOUNTER — Ambulatory Visit (HOSPITAL_BASED_OUTPATIENT_CLINIC_OR_DEPARTMENT_OTHER): Payer: 59 | Admitting: Hematology & Oncology

## 2015-08-15 VITALS — BP 130/75 | HR 75 | Temp 98.2°F | Resp 16 | Ht 68.0 in | Wt 182.0 lb

## 2015-08-15 DIAGNOSIS — C7951 Secondary malignant neoplasm of bone: Secondary | ICD-10-CM

## 2015-08-15 DIAGNOSIS — C50912 Malignant neoplasm of unspecified site of left female breast: Secondary | ICD-10-CM

## 2015-08-15 DIAGNOSIS — C50919 Malignant neoplasm of unspecified site of unspecified female breast: Secondary | ICD-10-CM

## 2015-08-15 LAB — CBC WITH DIFFERENTIAL (CANCER CENTER ONLY)
BASO#: 0 10*3/uL (ref 0.0–0.2)
BASO%: 0.3 % (ref 0.0–2.0)
EOS%: 4.4 % (ref 0.0–7.0)
Eosinophils Absolute: 0.2 10*3/uL (ref 0.0–0.5)
HCT: 39 % (ref 34.8–46.6)
HGB: 13 g/dL (ref 11.6–15.9)
LYMPH#: 1.4 10*3/uL (ref 0.9–3.3)
LYMPH%: 37 % (ref 14.0–48.0)
MCH: 28.1 pg (ref 26.0–34.0)
MCHC: 33.3 g/dL (ref 32.0–36.0)
MCV: 84 fL (ref 81–101)
MONO#: 0.4 10*3/uL (ref 0.1–0.9)
MONO%: 10.5 % (ref 0.0–13.0)
NEUT%: 47.8 % (ref 39.6–80.0)
NEUTROS ABS: 1.9 10*3/uL (ref 1.5–6.5)
PLATELETS: 176 10*3/uL (ref 145–400)
RBC: 4.63 10*6/uL (ref 3.70–5.32)
RDW: 12.7 % (ref 11.1–15.7)
WBC: 3.9 10*3/uL (ref 3.9–10.0)

## 2015-08-15 NOTE — Progress Notes (Signed)
Hematology and Oncology Follow Up Visit  Nancy Herring [id number] 04-Jan-1956 59 y.o. 08/15/2015   Principle Diagnosis:   Metastatic breast cancer-bone only disease  Current Therapy:    Observation     Interim History:  Nancy Herring is back for a long-awaited follow-up. We haven't seen her for quite a while. After we saw her in November 2014, for some reason, she never came back to see [place].  We'll we had seen her before, she was getting Zometa every 3 months. She also was on Femara.  She stopped taking the Femara. She's not sure when she stopped taking the Femara.  She was seen by her family doctor. It was felt that she needed to come back to see [place] for follow-up. As such, she now is back seen [place].  She complains of some discomfort in the lateral lower right ribs. She has no other bone pain. This is been chronic.  She's had no change in bowel or bladder habits.  She's had no rashes.  His been no leg swelling.  When we checked her CA 27.29 back in November 2014, it was only 15.  She's had no headache. She's had no swallowing difficulties. She's had no fever.  Overall, her performance status is ECOG 0.  Medications:  Current outpatient prescriptions:  .  Biotin 2.5 MG CAPS, Take by mouth every morning., Disp: , Rfl:  .  cholecalciferol (VITAMIN D) 1000 UNITS tablet, Take 2,000 Units by mouth daily. , Disp: , Rfl:  .  Cyanocobalamin (VITAMIN B-12) 1000 MCG SUBL, Place under the tongue daily., Disp: , Rfl:  .  Multiple Vitamin (MULTI-VITAMIN DAILY PO), Take by mouth every morning., Disp: , Rfl:   Allergies: No Known Allergies  Past Medical History, Surgical history, Social history, and Family History were reviewed and updated.  Review of Systems: As above  Physical Exam:  height is 5\' 8"  (1.727 m) and weight is 182 lb (82.555 kg). Her oral temperature is 98.2 F (36.8 C). Her blood pressure is 130/75 and her pulse is 75. Her respiration is 16.   Wt Readings from  Last 3 Encounters:  08/15/15 182 lb (82.555 kg)  07/27/15 182 lb 11.2 oz (82.872 kg)  06/14/14 186 lb 8 oz (84.596 kg)     Well-developed and well-nourished African-American female in no obvious distress. Head and neck exam shows no ocular or oral lesions. There are no palpable cervical or supraclavicular lymph nodes. Lungs are clear bilaterally. Cardiac exam regular rate and rhythm with no murmurs, rubs or bruits. Abdomen is soft. She has good bowel sounds. There is no fluid wave. There is no palpable liver or spleen tip. Back exam shows no tenderness over the spine, ribs or hips. Extremities shows no clubbing, cyanosis or edema. She has good range of motion of her joints. Skin exam shows no rashes, ecchymoses or petechia. Neurological exam shows no focal neurological deficits.  Lab Results  Component Value Date   WBC 3.9 08/15/2015   HGB 13.0 08/15/2015   HCT 39.0 08/15/2015   MCV 84 08/15/2015   PLT 176 08/15/2015     Chemistry      Component Value Date/Time   NA 143 08/15/2015 0948   NA 142 11/08/2013 1008   K 4.3 08/15/2015 0948   K 3.9 11/08/2013 1008   CL 106 08/15/2015 0948   CL 103 11/08/2013 1008   CO2 26 08/15/2015 0948   CO2 29 11/08/2013 1008   BUN 10 08/15/2015 0948   BUN  11 11/08/2013 1008   CREATININE 0.93 08/15/2015 0948   CREATININE 1.4* 11/08/2013 1008      Component Value Date/Time   CALCIUM 9.5 08/15/2015 0948   CALCIUM 9.6 11/08/2013 1008   ALKPHOS 134* 08/15/2015 0948   ALKPHOS 124* 11/08/2013 1008   AST 17 08/15/2015 0948   AST 24 11/08/2013 1008   ALT 17 08/15/2015 0948   ALT 25 11/08/2013 1008   BILITOT 0.4 08/15/2015 0948   BILITOT 0.70 11/08/2013 1008         Impression and Plan: Nancy Herring is a 59 year old African-American female. She's had metastatic breast cancer. I have been seeing her for many years. When we last saw her, she only had oligometastatic disease.  Again a matcher why she never came back to see [place]. I think she just  felt well and just in think that we were going to help her out.  I think we probably do need to get her set up with a PET scan so we can see how things look. Her wrap I talked to her about getting back on aromatase inhibitor therapy. I think that she is reluctant to do anything because she feels well.  I think we can probably start treating her with Xgeva. I think this would be very reasonable.  I will plan to see her back in another couple months.  I spent about 45 minutes with her today. His been a longtime since I have seen her. I wanted to make sure she understood why I am recommending the aromatase inhibitor therapy. Again, she just seems to be feeling well and doesn't think that she needs any therapy right now.   Volanda Napoleon, MD 8/24/20165:49 PM

## 2015-08-16 ENCOUNTER — Telehealth: Payer: Self-pay | Admitting: *Deleted

## 2015-08-16 LAB — COMPREHENSIVE METABOLIC PANEL
ALT: 17 U/L (ref 6–29)
AST: 17 U/L (ref 10–35)
Albumin: 4.1 g/dL (ref 3.6–5.1)
Alkaline Phosphatase: 134 U/L — ABNORMAL HIGH (ref 33–130)
BILIRUBIN TOTAL: 0.4 mg/dL (ref 0.2–1.2)
BUN: 10 mg/dL (ref 7–25)
CHLORIDE: 106 mmol/L (ref 98–110)
CO2: 26 mmol/L (ref 20–31)
CREATININE: 0.93 mg/dL (ref 0.50–1.05)
Calcium: 9.5 mg/dL (ref 8.6–10.4)
GLUCOSE: 89 mg/dL (ref 65–99)
Potassium: 4.3 mmol/L (ref 3.5–5.3)
SODIUM: 143 mmol/L (ref 135–146)
Total Protein: 7.1 g/dL (ref 6.1–8.1)

## 2015-08-16 LAB — CANCER ANTIGEN 27.29: CA 27.29: 16 U/mL (ref 0–39)

## 2015-08-16 NOTE — Telephone Encounter (Addendum)
Patient aware of results  ----- Message from Volanda Napoleon, MD sent at 08/16/2015 12:32 PM EDT ----- Please call and tell her that her labs look fantastic. Thanks

## 2015-08-28 ENCOUNTER — Other Ambulatory Visit: Payer: Self-pay | Admitting: Obstetrics and Gynecology

## 2015-08-29 ENCOUNTER — Encounter (HOSPITAL_COMMUNITY)
Admission: RE | Admit: 2015-08-29 | Discharge: 2015-08-29 | Disposition: A | Discharge status: Home / Self Care | Payer: 59 | Source: Ambulatory Visit | Attending: Hematology & Oncology | Admitting: Hematology & Oncology

## 2015-08-29 DIAGNOSIS — C50919 Malignant neoplasm of unspecified site of unspecified female breast: Secondary | ICD-10-CM | POA: Diagnosis not present

## 2015-08-29 LAB — GLUCOSE, CAPILLARY: Glucose-Capillary: 108 mg/dL — ABNORMAL HIGH (ref 65–99)

## 2015-08-29 LAB — CYTOLOGY - PAP

## 2015-08-29 IMAGING — PT NM PET TUM IMG RESTAG (PS) SKULL BASE T - THIGH
2 series · 3 of 3 positions shown · non-contrast
Comparison: 04/09/2011 PET.  Chest CT of 08/17/2013.

CLINICAL DATA: Subsequent treatment strategy for restaging of
metastatic breast cancer with bone metastasis..

EXAM:
NUCLEAR MEDICINE PET SKULL BASE TO THIGH
TECHNIQUE: 8.9 mCi F-18 FDG was injected intravenously. Full-ring PET imaging
was performed from the skull base to thigh after the radiotracer. CT
data was obtained and used for attenuation correction and anatomic
localization.
FASTING BLOOD GLUCOSE:  Value: 108 mg/dl

[Series 1139: results mm oncology reading · 1.27mm/px · 2 of 2 slices shown (1 of 2)]
[im 1/2]
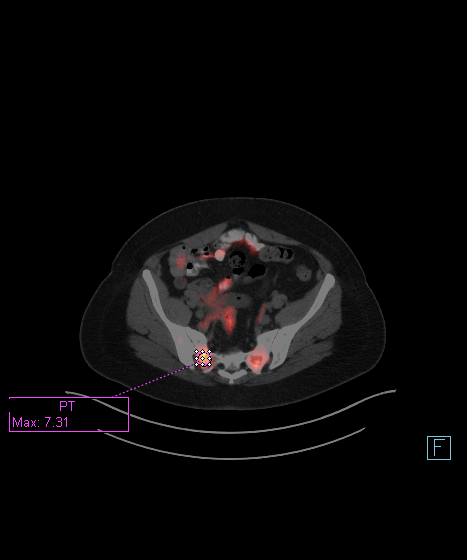
[im 2/2]
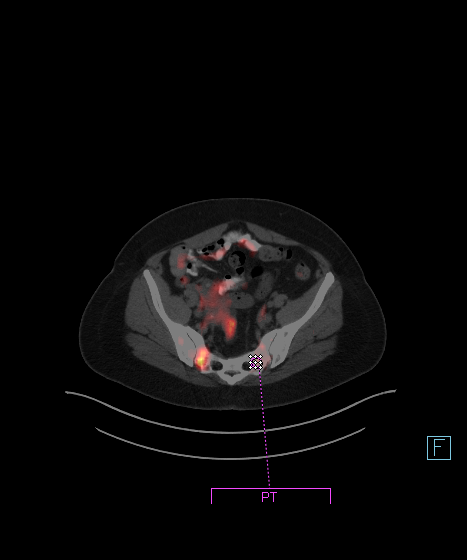

[Series 1191: results mm oncology reading · 5.0mm · 1.06mm/px · 1 of 1 slices shown (2 of 2)]
[im 1/1]
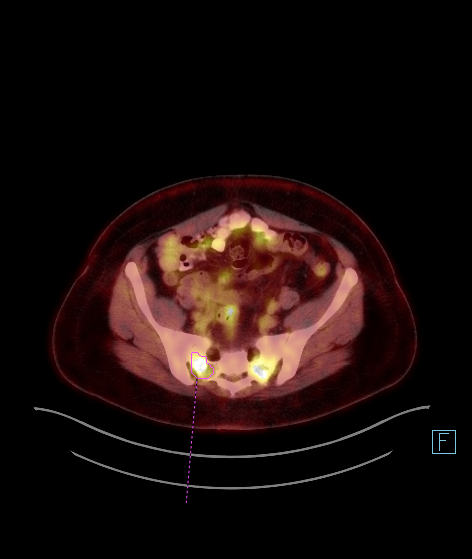

[3 of 3 positions shown; findings below may reference images not displayed]

FINDINGS: NECK

Mild diffuse thyroid hypermetabolism is favored to be physiologic.
No cervical nodal hypermetabolism.

CHEST

No areas of abnormal hypermetabolism.

ABDOMEN/PELVIS

No areas of abnormal hypermetabolism.

SKELETON

Redemonstration of osseous hypermetabolism, most apparent within the
sacrum. A left-sided index lesion measures 3.6 x 2.3 cm and a S.U.V.
max of 5.6 on image 139. Compare 2.4 x 2.0 cm and a S.U.V. max of
6.1.

Increased heterogeneous sclerosis within the right side of the
sacrum with new hypermetabolism. This measures a S.U.V. max of 7.3.

Lesion arising from the right eleventh rib measures 3.9 x 3.3 cm and
a S.U.V. max of 4.9. Compare 2.9 x 2.2 cm and a S.U.V. max of 5.5 on
the prior exam.

CT IMAGES PERFORMED FOR ATTENUATION CORRECTION

No cervical adenopathy. Mild cardiomegaly. Mediastinal and right
hilar calcified nodes, likely related to old granulomatous disease.
No axillary adenopathy. No internal mammary adenopathy. 2 mm right
upper lobe subpleural nodule on image 26, likely new. Subpleural
nodules within the posterior right upper lobe and anterior right
lower lobe measure on the order of 2 mm on images 30 and 31, likely
new.

Subpleural 2 mm left upper lobe pulmonary nodule on image 23 is new.
No abdominopelvic adenopathy. Anterior abdominal wall laxity.
Fibroid uterus.
IMPRESSION: 1. Mild progression of osseous metastasis.
2. New small bilateral pulmonary nodules. These are subpleural in
distribution and could represent subpleural lymph nodes. Early
pulmonary metastasis (below the resolution of PET) cannot be
excluded.

## 2015-08-29 MED ORDER — FLUDEOXYGLUCOSE F - 18 (FDG) INJECTION
8.9200 | Freq: Once | INTRAVENOUS | Status: DC | PRN
  Administered 2015-08-29: 8.92 via INTRAVENOUS
  Filled 2015-08-29: qty 8.92

## 2015-08-30 ENCOUNTER — Telehealth: Payer: Self-pay | Admitting: *Deleted

## 2015-08-30 MED ORDER — EXEMESTANE 25 MG PO TABS: 25.0000 mg | ORAL_TABLET | Freq: Every day | ORAL | Status: DC

## 2015-08-30 NOTE — Addendum Note (Signed)
Addended by: Burney Gauze R on: 08/30/2015 01:15 PM   Modules accepted: Orders

## 2015-08-30 NOTE — Telephone Encounter (Addendum)
Patient returned call and spoke to Dr Marin Olp  ----- Message from Volanda Napoleon, MD sent at 08/29/2015  5:59 PM EDT ----- I left a message on her answering machine or cell phone telling her to call me back about the PET scan results. I told her that I do not like what I saw him and that she is going to have to get back on some form of therapy. I think she took herself off the AI pills. I'm pretty sure we get her back on the pills and get this under control so I told her to make sure she calls me tomorrow or Friday. I'll be in the office both days.  Laurey Arrow

## 2015-09-12 ENCOUNTER — Other Ambulatory Visit: Payer: Self-pay | Admitting: Obstetrics and Gynecology

## 2015-10-30 ENCOUNTER — Other Ambulatory Visit: Payer: Self-pay

## 2015-10-30 DIAGNOSIS — Z1231 Encounter for screening mammogram for malignant neoplasm of breast: Secondary | ICD-10-CM

## 2015-11-07 ENCOUNTER — Other Ambulatory Visit: Payer: 59

## 2015-11-07 ENCOUNTER — Ambulatory Visit: Payer: 59 | Admitting: Hematology & Oncology

## 2015-11-07 ENCOUNTER — Ambulatory Visit: Payer: 59

## 2015-11-21 ENCOUNTER — Ambulatory Visit
Admission: RE | Admit: 2015-11-21 | Discharge: 2015-11-21 | Disposition: A | Discharge status: Home / Self Care | Payer: 59 | Source: Ambulatory Visit

## 2015-11-21 DIAGNOSIS — Z1231 Encounter for screening mammogram for malignant neoplasm of breast: Secondary | ICD-10-CM

## 2016-04-21 ENCOUNTER — Telehealth: Payer: Self-pay | Admitting: Internal Medicine

## 2016-04-21 NOTE — Telephone Encounter (Signed)
Left message on machine for patient to return our call to see how she is doing.  

## 2016-04-21 NOTE — Telephone Encounter (Signed)
Patient Name: MAKENZIE SNELLGROVE  DOB: May 13, 1956    Initial Comment Caller states she's having some shortness of breath.   Nurse Assessment  Nurse: Verlin Fester RN, [facility] Date/Time Eilene Ghazi Time): 04/21/2016 12:48:36 PM  Confirm and document reason for call. If symptomatic, describe symptoms. You must click the next button to save text entered. ---Patient states she is having a cold and she has shortness of breath and it is getting worse. States she feels like her heart is fluttering  Has the patient traveled out of the country within the last 30 days? ---No  Does the patient have any new or worsening symptoms? ---Yes  Will a triage be completed? ---Yes  Related visit to physician within the last 2 weeks? ---No  Does the PT have any chronic conditions? (i.e. diabetes, asthma, etc.) ---No  Is this a behavioral health or substance abuse call? ---No     Guidelines    Guideline Title Affirmed Question Affirmed Notes  Breathing Difficulty Extra heart beats OR irregular heart beating (i.e., "palpitations")    Final Disposition User   Go to ED Now Verlin Fester, RN, [facility]    Referrals  [facility] - ED  [facility] - ED   Disagree/Comply: Comply

## 2016-04-22 ENCOUNTER — Encounter: Payer: Self-pay | Admitting: Adult Health

## 2016-04-22 ENCOUNTER — Ambulatory Visit (INDEPENDENT_AMBULATORY_CARE_PROVIDER_SITE_OTHER): Payer: 59 | Admitting: Adult Health

## 2016-04-22 VITALS — BP 124/70 | Temp 98.3°F | Wt 179.9 lb

## 2016-04-22 DIAGNOSIS — J209 Acute bronchitis, unspecified: Secondary | ICD-10-CM

## 2016-04-22 MED ORDER — METHYLPREDNISOLONE 4 MG PO TBPK: ORAL_TABLET | ORAL | Status: DC

## 2016-04-22 NOTE — Progress Notes (Signed)
Subjective:    Patient ID: Nancy Herring, female    DOB: [birth year], 60 y.o.   MRN: [id number]  HPI  60 year old female who presents to the office today for the complaint of SOB. She reports that she had a cold about a week ago, the cold has started to resolve. 3 days ago she started to feel short of breath with exertion. She has no shortness of breath when she is sitting still. She does have a cough that started about a week ago. The cough is non productive. She does not have a history of asthma, bronchitis or pneumonia. She does not smoke.   She denies any fevers, n/v/d/ blurred vision.  Review of Systems  Constitutional: Positive for activity change. Negative for fever, chills, diaphoresis, appetite change and fatigue.  Respiratory: Positive for cough, shortness of breath and wheezing. Negative for chest tightness and stridor.   Cardiovascular: Negative.   Gastrointestinal: Negative.   Neurological: Negative.    Past Medical History  Diagnosis Date  . Breast cancer ([place])     Metastatic to bone - followed by Dr. Marin Olp  . GERD (gastroesophageal reflux disease)     Social History   Social History  . Marital Status: Married    Spouse Name: N/A  . Number of Children: N/A  . Years of Education: N/A   Occupational History  . Not on file.   Social History Main Topics  . Smoking status: Never Smoker   . Smokeless tobacco: Never Used  . Alcohol Use: No  . Drug Use: No  . Sexual Activity: Not on file   Other Topics Concern  . Not on file   Social History Narrative   Sister - Paul Half    Past Surgical History  Procedure Laterality Date  . Breast lumpectomy  2007 & 2009    Family History  Problem Relation Age of Onset  . Heart disease Neg Hx   . Colon cancer Neg Hx   . Breast cancer Neg Hx   . Diabetes Father     brother  . Prostate cancer Neg Hx   . Hypertension Brother   . Crohn's disease Sister     No Known Allergies  Current Outpatient  Prescriptions on File Prior to Visit  Medication Sig Dispense Refill  . Biotin 2.5 MG CAPS Take by mouth every morning.    . cholecalciferol (VITAMIN D) 1000 UNITS tablet Take 2,000 Units by mouth daily.     . Cyanocobalamin (VITAMIN B-12) 1000 MCG SUBL Place under the tongue daily.    . Multiple Vitamin (MULTI-VITAMIN DAILY PO) Take by mouth every morning.    Marland Kitchen exemestane (AROMASIN) 25 MG tablet Take 1 tablet (25 mg total) by mouth daily after breakfast. (Patient not taking: Reported on 04/22/2016) 90 tablet 3   No current facility-administered medications on file prior to visit.    BP 124/70 mmHg  Temp(Src) 98.3 F (36.8 C) (Oral)  Wt 179 lb 14.4 oz (81.602 kg)       Objective:   Physical Exam  Constitutional: She is oriented to person, place, and time. She appears well-developed and well-nourished. No distress.  Cardiovascular: Normal rate, regular rhythm, normal heart sounds and intact distal pulses.  Exam reveals no gallop and no friction rub.   No murmur heard. Pulmonary/Chest: Effort normal. No respiratory distress. She has no decreased breath sounds. She has wheezes in the right lower field and the left lower field. She has no rhonchi. She has  no rales. She exhibits no tenderness.  Neurological: She is alert and oriented to person, place, and time.  Skin: Skin is warm and dry. No rash noted. She is not diaphoretic. No erythema. No pallor.  Psychiatric: She has a normal mood and affect. Her behavior is normal. Judgment and thought content normal.  Nursing note and vitals reviewed.     Assessment & Plan:  1. Acute bronchitis, unspecified organism - methylPREDNISolone (MEDROL DOSEPAK) 4 MG TBPK tablet; Take as directed  Dispense: 21 tablet; Refill: 0 - Follow up if no improvement.  - Consider referral to pulmonary   Dorothyann Peng, NP

## 2016-04-22 NOTE — Patient Instructions (Addendum)
It was great seeing you again.   I think you are having an asthma or bronchitis flare.   I have sent in a medrol dose pack. Take this as directed.   Follow up if no improvement.   Acute Bronchitis Bronchitis is inflammation of the airways that extend from the windpipe into the lungs (bronchi). The inflammation often causes mucus to develop. This leads to a cough, which is the most common symptom of bronchitis.  In acute bronchitis, the condition usually develops suddenly and goes away over time, usually in a couple weeks. Smoking, allergies, and asthma can make bronchitis worse. Repeated episodes of bronchitis may cause further lung problems.  CAUSES Acute bronchitis is most often caused by the same virus that causes a cold. The virus can spread from person to person (contagious) through coughing, sneezing, and touching contaminated objects. SIGNS AND SYMPTOMS   Cough.   Fever.   Coughing up mucus.   Body aches.   Chest congestion.   Chills.   Shortness of breath.   Sore throat.  DIAGNOSIS  Acute bronchitis is usually diagnosed through a physical exam. Your health care provider will also ask you questions about your medical history. Tests, such as chest X-rays, are sometimes done to rule out other conditions.  TREATMENT  Acute bronchitis usually goes away in a couple weeks. Oftentimes, no medical treatment is necessary. Medicines are sometimes given for relief of fever or cough. Antibiotic medicines are usually not needed but may be prescribed in certain situations. In some cases, an inhaler may be recommended to help reduce shortness of breath and control the cough. A cool mist vaporizer may also be used to help thin bronchial secretions and make it easier to clear the chest.  HOME CARE INSTRUCTIONS  Get plenty of rest.   Drink enough fluids to keep your urine clear or pale yellow (unless you have a medical condition that requires fluid restriction). Increasing fluids  may help thin your respiratory secretions (sputum) and reduce chest congestion, and it will prevent dehydration.   Take medicines only as directed by your health care provider.  If you were prescribed an antibiotic medicine, finish it all even if you start to feel better.  Avoid smoking and secondhand smoke. Exposure to cigarette smoke or irritating chemicals will make bronchitis worse. If you are a smoker, consider using nicotine gum or skin patches to help control withdrawal symptoms. Quitting smoking will help your lungs heal faster.   Reduce the chances of another bout of acute bronchitis by washing your hands frequently, avoiding people with cold symptoms, and trying not to touch your hands to your mouth, nose, or eyes.   Keep all follow-up visits as directed by your health care provider.  SEEK MEDICAL CARE IF: Your symptoms do not improve after 1 week of treatment.  SEEK IMMEDIATE MEDICAL CARE IF:  You develop an increased fever or chills.   You have chest pain.   You have severe shortness of breath.  You have bloody sputum.   You develop dehydration.  You faint or repeatedly feel like you are going to pass out.  You develop repeated vomiting.  You develop a severe headache. MAKE SURE YOU:   Understand these instructions.  Will watch your condition.  Will get help right away if you are not doing well or get worse.   This information is not intended to replace advice given to you by your health care provider. Make sure you discuss any questions you have with  your health care provider.   Document Released: 01/15/2005 Document Revised: 12/29/2014 Document Reviewed: 05/31/2013 Elsevier Interactive Patient Education [employer].

## 2016-04-22 NOTE — Telephone Encounter (Signed)
Left message on machine for patient to return our call 

## 2016-05-01 ENCOUNTER — Telehealth: Payer: Self-pay | Admitting: Internal Medicine

## 2016-05-01 NOTE — Telephone Encounter (Signed)
See below

## 2016-05-01 NOTE — Telephone Encounter (Signed)
Patient needs an appt to to be re-evaluated. Please schedule.

## 2016-05-01 NOTE — Telephone Encounter (Signed)
Pt saw cory on 5-2-17and the bronchitis has not cleared up. Please advise

## 2016-05-01 NOTE — Telephone Encounter (Signed)
Pt scheduled  

## 2016-05-01 NOTE — Telephone Encounter (Signed)
lmom for pt to call back

## 2016-05-01 NOTE — Telephone Encounter (Signed)
Needs to be  re-evaluated. ?

## 2016-05-02 ENCOUNTER — Ambulatory Visit (INDEPENDENT_AMBULATORY_CARE_PROVIDER_SITE_OTHER): Payer: 59 | Admitting: Adult Health

## 2016-05-02 ENCOUNTER — Encounter: Payer: Self-pay | Admitting: Adult Health

## 2016-05-02 VITALS — BP 118/84 | Temp 98.3°F | Wt 180.9 lb

## 2016-05-02 DIAGNOSIS — J208 Acute bronchitis due to other specified organisms: Secondary | ICD-10-CM

## 2016-05-02 MED ORDER — PREDNISONE 10 MG PO TABS: ORAL_TABLET | ORAL | Status: DC

## 2016-05-02 NOTE — Patient Instructions (Addendum)
I have sent in another prescription for prednisone. Take this as directed on the bottle.   If you do not notice any improvement please let me know  Acute Bronchitis Bronchitis is inflammation of the airways that extend from the windpipe into the lungs (bronchi). The inflammation often causes mucus to develop. This leads to a cough, which is the most common symptom of bronchitis.  In acute bronchitis, the condition usually develops suddenly and goes away over time, usually in a couple weeks. Smoking, allergies, and asthma can make bronchitis worse. Repeated episodes of bronchitis may cause further lung problems.  CAUSES Acute bronchitis is most often caused by the same virus that causes a cold. The virus can spread from person to person (contagious) through coughing, sneezing, and touching contaminated objects. SIGNS AND SYMPTOMS   Cough.   Fever.   Coughing up mucus.   Body aches.   Chest congestion.   Chills.   Shortness of breath.   Sore throat.  DIAGNOSIS  Acute bronchitis is usually diagnosed through a physical exam. Your health care provider will also ask you questions about your medical history. Tests, such as chest X-rays, are sometimes done to rule out other conditions.  TREATMENT  Acute bronchitis usually goes away in a couple weeks. Oftentimes, no medical treatment is necessary. Medicines are sometimes given for relief of fever or cough. Antibiotic medicines are usually not needed but may be prescribed in certain situations. In some cases, an inhaler may be recommended to help reduce shortness of breath and control the cough. A cool mist vaporizer may also be used to help thin bronchial secretions and make it easier to clear the chest.  HOME CARE INSTRUCTIONS  Get plenty of rest.   Drink enough fluids to keep your urine clear or pale yellow (unless you have a medical condition that requires fluid restriction). Increasing fluids may help thin your respiratory  secretions (sputum) and reduce chest congestion, and it will prevent dehydration.   Take medicines only as directed by your health care provider.  If you were prescribed an antibiotic medicine, finish it all even if you start to feel better.  Avoid smoking and secondhand smoke. Exposure to cigarette smoke or irritating chemicals will make bronchitis worse. If you are a smoker, consider using nicotine gum or skin patches to help control withdrawal symptoms. Quitting smoking will help your lungs heal faster.   Reduce the chances of another bout of acute bronchitis by washing your hands frequently, avoiding people with cold symptoms, and trying not to touch your hands to your mouth, nose, or eyes.   Keep all follow-up visits as directed by your health care provider.  SEEK MEDICAL CARE IF: Your symptoms do not improve after 1 week of treatment.  SEEK IMMEDIATE MEDICAL CARE IF:  You develop an increased fever or chills.   You have chest pain.   You have severe shortness of breath.  You have bloody sputum.   You develop dehydration.  You faint or repeatedly feel like you are going to pass out.  You develop repeated vomiting.  You develop a severe headache. MAKE SURE YOU:   Understand these instructions.  Will watch your condition.  Will get help right away if you are not doing well or get worse.   This information is not intended to replace advice given to you by your health care provider. Make sure you discuss any questions you have with your health care provider.   Document Released: 01/15/2005 Document Revised: 12/29/2014  Document Reviewed: 05/31/2013 Elsevier Interactive Patient Education 2016 Elsevier Inc[employer]  

## 2016-05-02 NOTE — Progress Notes (Signed)
Subjective:    Patient ID: Nancy Herring, female    DOB: 05-07-1956, 60 y.o.   MRN: [id number]  HPI  60 year old female who presents to the clinic today for for follow up regarding a cough. I last saw her on 04/22/2016 and prescribed her a medrol dose pack for a perceived chronic bronchitis.  She took her prednisone as directed. Feels as though it helped until the day after she finished her symptoms returned. She is no complaining of a constant cough, feeling of " catching" in her upper chest and SOB.   Denies sinus pain/pressure, productive cough, wheezing, fevers  Review of Systems  Constitutional: Negative.   HENT: Negative.   Respiratory: Positive for cough and shortness of breath. Negative for apnea, choking, chest tightness and wheezing.   Cardiovascular: Negative.   Neurological: Negative.   Psychiatric/Behavioral: Negative.   All other systems reviewed and are negative.  Past Medical History  Diagnosis Date  . Breast cancer ([place])     Metastatic to bone - followed by Dr. Marin Olp  . GERD (gastroesophageal reflux disease)     Social History   Social History  . Marital Status: Married    Spouse Name: N/A  . Number of Children: N/A  . Years of Education: N/A   Occupational History  . Not on file.   Social History Main Topics  . Smoking status: Never Smoker   . Smokeless tobacco: Never Used  . Alcohol Use: No  . Drug Use: No  . Sexual Activity: Not on file   Other Topics Concern  . Not on file   Social History Narrative   Sister - Paul Half    Past Surgical History  Procedure Laterality Date  . Breast lumpectomy  2007 & 2009    Family History  Problem Relation Age of Onset  . Heart disease Neg Hx   . Colon cancer Neg Hx   . Breast cancer Neg Hx   . Diabetes Father     brother  . Prostate cancer Neg Hx   . Hypertension Brother   . Crohn's disease Sister     No Known Allergies  Current Outpatient Prescriptions on File Prior to Visit    Medication Sig Dispense Refill  . Biotin 2.5 MG CAPS Take by mouth every morning.    . cholecalciferol (VITAMIN D) 1000 UNITS tablet Take 2,000 Units by mouth daily.     . Cyanocobalamin (VITAMIN B-12) 1000 MCG SUBL Place under the tongue daily.    . Multiple Vitamin (MULTI-VITAMIN DAILY PO) Take by mouth every morning.    Marland Kitchen exemestane (AROMASIN) 25 MG tablet Take 1 tablet (25 mg total) by mouth daily after breakfast. (Patient not taking: Reported on 04/22/2016) 90 tablet 3   No current facility-administered medications on file prior to visit.    BP 118/84 mmHg  Temp(Src) 98.3 F (36.8 C) (Oral)  Wt 180 lb 14.4 oz (82.056 kg)       Objective:   Physical Exam  Constitutional: She is oriented to person, place, and time. She appears well-developed and well-nourished. No distress.  HENT:  Mouth/Throat: Oropharynx is clear and moist. No oropharyngeal exudate.  Eyes: Conjunctivae and EOM are normal. Pupils are equal, round, and reactive to light.  Neck: Normal range of motion. Neck supple.  Cardiovascular: Normal rate, regular rhythm, normal heart sounds and intact distal pulses.  Exam reveals no gallop and no friction rub.   No murmur heard. Pulmonary/Chest: Effort normal and breath sounds  normal. No respiratory distress. She has no wheezes. She has no rales. She exhibits no tenderness.  Lymphadenopathy:    She has no cervical adenopathy.  Neurological: She is alert and oriented to person, place, and time.  Skin: Skin is warm and dry. No rash noted. She is not diaphoretic. No erythema. No pallor.  Psychiatric: She has a normal mood and affect. Her behavior is normal. Judgment and thought content normal.  Vitals reviewed.      Assessment & Plan:  1. Acute bronchitis due to other specified organisms - Still believe this is bronchitis. Will increase dose of prednisone. She did not want antibiotics at this time - predniSONE (DELTASONE) 10 MG tablet; 40 mg x 3 days, 20 mg x 3 days, 10  mg x 3 days  Dispense: 21 tablet; Refill: 0 - Follow up if no improvement in the next 2-3 days  Dorothyann Peng, NP

## 2016-05-07 ENCOUNTER — Ambulatory Visit: Payer: 59 | Admitting: Adult Health

## 2016-11-21 ENCOUNTER — Other Ambulatory Visit: Payer: Self-pay | Admitting: Hematology & Oncology

## 2016-11-21 DIAGNOSIS — Z1231 Encounter for screening mammogram for malignant neoplasm of breast: Secondary | ICD-10-CM

## 2016-12-16 ENCOUNTER — Ambulatory Visit
Admission: RE | Admit: 2016-12-16 | Discharge: 2016-12-16 | Disposition: A | Discharge status: Home / Self Care | Payer: 59 | Source: Ambulatory Visit | Attending: Hematology & Oncology | Admitting: Hematology & Oncology

## 2016-12-16 DIAGNOSIS — Z1231 Encounter for screening mammogram for malignant neoplasm of breast: Secondary | ICD-10-CM

## 2017-12-21 ENCOUNTER — Other Ambulatory Visit: Payer: Self-pay | Admitting: Hematology & Oncology

## 2017-12-21 DIAGNOSIS — Z1231 Encounter for screening mammogram for malignant neoplasm of breast: Secondary | ICD-10-CM

## 2017-12-30 ENCOUNTER — Ambulatory Visit: Payer: 59 | Admitting: Family Medicine

## 2017-12-30 NOTE — Progress Notes (Signed)
HPI:   Prior pt Dr. Shawna Orleans, says needs to establish with Tommi Rumps, here for acute visit for:  R hand issues: -x 1-2 months -notices when wakes up in the morning mainly - tingling/numb sensation in first 3 digits R hand, pain in thenar eminence and wrist at times -no weakness, fevers, malaise, symptoms arm/shoulder/neck, wt loss, symptoms elsewhere -no known injury -R hand dominate         Note PMH, poor compliance.  ROS: See pertinent positives and negatives per HPI.  Past Medical History:  Diagnosis Date  . Breast cancer ([place])    Metastatic to bone - followed by Dr. Marin Olp  . GERD (gastroesophageal reflux disease)     Past Surgical History:  Procedure Laterality Date  . BREAST LUMPECTOMY  2007 & 2009    Family History  Problem Relation Age of Onset  . Heart disease Neg Hx   . Colon cancer Neg Hx   . Breast cancer Neg Hx   . Diabetes Father        brother  . Prostate cancer Neg Hx   . Hypertension Brother   . Crohn's disease Sister     Social History   Socioeconomic History  . Marital status: Married    Spouse name: None  . Number of children: None  . Years of education: None  . Highest education level: None  Social Needs  . Financial resource strain: None  . Food insecurity - worry: None  . Food insecurity - inability: None  . Transportation needs - medical: None  . Transportation needs - non-medical: None  Occupational History  . None  Tobacco Use  . Smoking status: Never Smoker  . Smokeless tobacco: Never Used  Substance and Sexual Activity  . Alcohol use: No    Alcohol/week: 0.0 oz  . Drug use: No  . Sexual activity: None  Other Topics Concern  . 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  Biotin 2.5 MG CAPS, Take by mouth every morning., Disp: , Rfl:  .  cholecalciferol (VITAMIN D) 1000 UNITS tablet, Take 2,000 Units by mouth daily. , Disp: , Rfl:  .  Cyanocobalamin (VITAMIN B-12) 1000 MCG  SUBL, Place under the tongue daily., Disp: , Rfl:  .  exemestane (AROMASIN) 25 MG tablet, Take 1 tablet (25 mg total) by mouth daily after breakfast., Disp: 90 tablet, Rfl: 3 .  Multiple Vitamin (MULTI-VITAMIN DAILY PO), Take by mouth every morning., Disp: , Rfl:   EXAM:  Vitals:   01/01/18 0925  BP: 120/82  Pulse: 82  Temp: 98 F (36.7 C)    Body mass index is 26.46 kg/m.  GENERAL: vitals reviewed and listed above, alert, oriented, appears well hydrated and in no acute distress  HEENT: atraumatic, conjunttiva clear, no obvious abnormalities on inspection of external nose and ears  MS: moves all extremities without noticeable abnormality, normal inspection of both hands/wrists and forearms.  Normal strength and sensitivity to light touch in bilateral hands and forearms, reports feels different on sensitivity touching in the right first digits, but no significant appreciable atrophy on gross exam, positive Tinel's and Phalen's on the right, normal range of motion of the head and neck without reproduction of symptoms, normal strength throughout in the upper extremities  PSYCH: pleasant and cooperative, no obvious depression or anxiety  ASSESSMENT AND PLAN:  Discussed the following assessment and plan:  Carpal tunnel syndrome of right wrist  -we  discussed possible serious and likely etiologies, workup and treatment, treatment risks and return precautions -suspect carpal tunnel syndrome as her symptoms and findings are classic for this -after this discussion, Kaylee opted for trial cockup wrist brace at night, close follow-up for her new patient visit with Tommi Rumps, discussed options here for new patients and she wants to see Georgina Snell -cock up wrist splint applied and fitted -follow up advised 1 month -On brief review of chart advised her she is due for oncology follow-up and advised her to contact them today for an appointment, she agreed - says "I know I need to" " I will call" -of  course, we advised Tabetha  to return or notify a doctor immediately if symptoms worsen or persist or new concerns arise.    Patient Instructions  BEFORE YOU LEAVE: -R cock up wrist brace -follow up: 1 month - NPV with Cory  Use the brace at night - apply as we showed you - not tight.  Call your oncologist today for an appointment.  I hope you are feeling better soon! Seek sooner if your symptoms worsen or new concerns arise.      Colin Benton R., DO

## 2018-01-01 ENCOUNTER — Ambulatory Visit (INDEPENDENT_AMBULATORY_CARE_PROVIDER_SITE_OTHER): Payer: 59 | Admitting: Family Medicine

## 2018-01-01 ENCOUNTER — Encounter: Payer: Self-pay | Admitting: Family Medicine

## 2018-01-01 VITALS — BP 120/82 | HR 82 | Temp 98.0°F | Ht 68.0 in | Wt 174.0 lb

## 2018-01-01 DIAGNOSIS — G5601 Carpal tunnel syndrome, right upper limb: Secondary | ICD-10-CM | POA: Diagnosis not present

## 2018-01-01 NOTE — Patient Instructions (Signed)
BEFORE YOU LEAVE: -R cock up wrist brace -follow up: 1 month - NPV with Cory  Use the brace at night - apply as we showed you - not tight.  Call your oncologist today for an appointment.  I hope you are feeling better soon! Seek sooner if your symptoms worsen or new concerns arise.

## 2018-01-12 ENCOUNTER — Ambulatory Visit
Admission: RE | Admit: 2018-01-12 | Discharge: 2018-01-12 | Disposition: A | Discharge status: Home / Self Care | Payer: 59 | Source: Ambulatory Visit | Attending: Hematology & Oncology | Admitting: Hematology & Oncology

## 2018-01-12 DIAGNOSIS — Z1231 Encounter for screening mammogram for malignant neoplasm of breast: Secondary | ICD-10-CM

## 2018-01-13 ENCOUNTER — Other Ambulatory Visit: Payer: Self-pay | Admitting: Hematology & Oncology

## 2018-01-13 DIAGNOSIS — N63 Unspecified lump in unspecified breast: Secondary | ICD-10-CM

## 2018-01-19 ENCOUNTER — Ambulatory Visit
Admission: RE | Admit: 2018-01-19 | Discharge: 2018-01-19 | Disposition: A | Discharge status: Home / Self Care | Payer: 59 | Source: Ambulatory Visit | Attending: Hematology & Oncology | Admitting: Hematology & Oncology

## 2018-01-19 ENCOUNTER — Other Ambulatory Visit: Payer: Self-pay | Admitting: Hematology & Oncology

## 2018-01-19 DIAGNOSIS — N6320 Unspecified lump in the left breast, unspecified quadrant: Secondary | ICD-10-CM

## 2018-01-19 DIAGNOSIS — N63 Unspecified lump in unspecified breast: Secondary | ICD-10-CM

## 2018-01-20 ENCOUNTER — Ambulatory Visit
Admission: RE | Admit: 2018-01-20 | Discharge: 2018-01-20 | Disposition: A | Discharge status: Home / Self Care | Payer: 59 | Source: Ambulatory Visit | Attending: Hematology & Oncology | Admitting: Hematology & Oncology

## 2018-01-20 ENCOUNTER — Other Ambulatory Visit: Payer: Self-pay | Admitting: Hematology & Oncology

## 2018-01-20 DIAGNOSIS — N6320 Unspecified lump in the left breast, unspecified quadrant: Secondary | ICD-10-CM

## 2018-01-27 ENCOUNTER — Other Ambulatory Visit: Payer: Self-pay | Admitting: Family

## 2018-01-27 DIAGNOSIS — C50912 Malignant neoplasm of unspecified site of left female breast: Principal | ICD-10-CM

## 2018-01-27 DIAGNOSIS — Z170 Estrogen receptor positive status [ER+]: Principal | ICD-10-CM

## 2018-01-27 DIAGNOSIS — C50911 Malignant neoplasm of unspecified site of right female breast: Secondary | ICD-10-CM

## 2018-01-28 ENCOUNTER — Other Ambulatory Visit: Payer: Self-pay

## 2018-01-28 ENCOUNTER — Inpatient Hospital Stay: Payer: 59 | Attending: Hematology & Oncology | Admitting: Hematology & Oncology

## 2018-01-28 ENCOUNTER — Telehealth: Payer: Self-pay | Admitting: Pharmacy Technician

## 2018-01-28 ENCOUNTER — Encounter: Payer: Self-pay | Admitting: Pharmacist

## 2018-01-28 ENCOUNTER — Other Ambulatory Visit: Payer: Self-pay | Admitting: *Deleted

## 2018-01-28 ENCOUNTER — Inpatient Hospital Stay: Payer: 59

## 2018-01-28 ENCOUNTER — Telehealth: Payer: Self-pay | Admitting: Pharmacist

## 2018-01-28 ENCOUNTER — Encounter: Payer: Self-pay | Admitting: Hematology & Oncology

## 2018-01-28 VITALS — BP 145/67 | HR 74 | Temp 97.7°F | Resp 16 | Wt 175.0 lb

## 2018-01-28 DIAGNOSIS — C50011 Malignant neoplasm of nipple and areola, right female breast: Secondary | ICD-10-CM

## 2018-01-28 DIAGNOSIS — C50912 Malignant neoplasm of unspecified site of left female breast: Principal | ICD-10-CM

## 2018-01-28 DIAGNOSIS — C50512 Malignant neoplasm of lower-outer quadrant of left female breast: Secondary | ICD-10-CM | POA: Insufficient documentation

## 2018-01-28 DIAGNOSIS — Z170 Estrogen receptor positive status [ER+]: Principal | ICD-10-CM

## 2018-01-28 DIAGNOSIS — Z780 Asymptomatic menopausal state: Secondary | ICD-10-CM

## 2018-01-28 DIAGNOSIS — C7951 Secondary malignant neoplasm of bone: Secondary | ICD-10-CM | POA: Diagnosis not present

## 2018-01-28 DIAGNOSIS — C50919 Malignant neoplasm of unspecified site of unspecified female breast: Secondary | ICD-10-CM

## 2018-01-28 DIAGNOSIS — C50911 Malignant neoplasm of unspecified site of right female breast: Secondary | ICD-10-CM

## 2018-01-28 LAB — CBC WITH DIFFERENTIAL (CANCER CENTER ONLY)
BASOS ABS: 0 10*3/uL (ref 0.0–0.1)
BASOS PCT: 0 %
EOS PCT: 3 %
Eosinophils Absolute: 0.2 10*3/uL (ref 0.0–0.5)
HCT: 37.9 % (ref 34.8–46.6)
Hemoglobin: 12.4 g/dL (ref 11.6–15.9)
Lymphocytes Relative: 36 %
Lymphs Abs: 1.6 10*3/uL (ref 0.9–3.3)
MCH: 27.9 pg (ref 26.0–34.0)
MCHC: 32.7 g/dL (ref 32.0–36.0)
MCV: 85.2 fL (ref 81.0–101.0)
MONO ABS: 0.3 10*3/uL (ref 0.1–0.9)
Monocytes Relative: 8 %
Neutro Abs: 2.4 10*3/uL (ref 1.5–6.5)
Neutrophils Relative %: 53 %
PLATELETS: 155 10*3/uL (ref 145–400)
RBC: 4.45 MIL/uL (ref 3.70–5.32)
RDW: 12.6 % (ref 11.1–15.7)
WBC Count: 4.6 10*3/uL (ref 3.9–10.0)

## 2018-01-28 LAB — CMP (CANCER CENTER ONLY)
ALBUMIN: 3.8 g/dL (ref 3.5–5.0)
ALT: 38 U/L (ref 0–55)
AST: 33 U/L (ref 5–34)
Alkaline Phosphatase: 185 U/L — ABNORMAL HIGH (ref 40–150)
Anion gap: 8 (ref 3–11)
BUN: 14 mg/dL (ref 7–26)
CO2: 27 mmol/L (ref 22–29)
Calcium: 9.3 mg/dL (ref 8.4–10.4)
Chloride: 107 mmol/L (ref 98–109)
Creatinine: 1.02 mg/dL (ref 0.60–1.10)
GFR, EST NON AFRICAN AMERICAN: 58 mL/min — AB (ref >60)
GFR, Est AFR Am: >60 mL/min (ref >60)
Glucose, Bld: 108 mg/dL (ref 70–140)
Potassium: 4.2 mmol/L (ref 3.5–5.1)
Sodium: 142 mmol/L (ref 136–145)
Total Bilirubin: 0.4 mg/dL (ref 0.2–1.2)
Total Protein: 7.2 g/dL (ref 6.4–8.3)

## 2018-01-28 MED ORDER — PALBOCICLIB 100 MG PO CAPS: 100.0000 mg | ORAL_CAPSULE | Freq: Every day | ORAL | 4 refills | Status: AC

## 2018-01-28 MED ORDER — PALBOCICLIB 100 MG PO CAPS: 100.0000 mg | ORAL_CAPSULE | Freq: Every day | ORAL | 4 refills | Status: DC

## 2018-01-28 NOTE — Telephone Encounter (Signed)
Oral Oncology Patient Advocate Encounter  A test claim performed at [facility] revealed that a prior authorization for Nancy Herring is required.  PA submitted on CoverMyMeds Key [id number] Status is pending  Oral Oncology Clinic will continue to follow.  Fabio Asa. Melynda Keller, [place] Patient [place] [phone] 01/28/2018 1:18 PM

## 2018-01-28 NOTE — Telephone Encounter (Signed)
Oral Chemotherapy Pharmacist Encounter   Enrolled patient in [place]. This should decrease her copay. Per [facility] her current copay is $1719.44.  Copay card information BIN: [id number] GROUP: [id number] ID#: [id number] EXPIRATION DATE: 12/22/2019   Darl Pikes, PharmD, BCPS Hematology/Oncology Clinical Pharmacist [facility] [phone]  01/28/2018 4:03 PM

## 2018-01-28 NOTE — Telephone Encounter (Signed)
Oral Oncology Pharmacist Encounter  Received new prescription for Ibrance (palbociclib) for the treatment of metastatic/recurent breast cancer, planned duration until disease progression or unacceptable drug toxicity.  CBC from 01/28/18 assessed, no relevant lab abnormalities. Prescription dose and frequency assessed.   Current medication list in Epic reviewed, no DDIs with Ibrance identified.  Prescription has been e-scribed to the [facility] for benefits analysis and approval.  Oral Oncology Clinic will continue to follow for insurance authorization, copayment issues, initial counseling and start date.  Darl Pikes, PharmD, BCPS Hematology/Oncology Clinical Pharmacist [facility] [place] [facility] [phone]  01/28/2018 12:23 PM

## 2018-01-28 NOTE — Telephone Encounter (Signed)
Erroneous Encounter

## 2018-01-28 NOTE — Telephone Encounter (Signed)
Oral Oncology Patient Advocate Encounter  Prior Authorization for [facility] has been approved.    PA# [id number] Effective dates: 01/28/2018 through 01/28/2019  Oral Oncology Clinic will continue to follow for patient counseling and continued prescription processing.   Fabio Asa. Melynda Keller, [place] Patient [place] [phone] 01/28/2018 1:25 PM

## 2018-01-28 NOTE — Telephone Encounter (Signed)
Oral Chemotherapy Pharmacist Encounter   Due to insurance restriction Ibrance prescription has to be sent to [facility]. Supportive information will be faxedto the pharmacy to facilitate fill her prescription.  Attempted to call and share this information with Ms. Italiano. LVM for her to give me a call back.   Darl Pikes, PharmD, BCPS Hematology/Oncology Clinical Pharmacist [facility] [place] [facility] [phone]  01/28/2018 3:12 PM

## 2018-01-29 ENCOUNTER — Other Ambulatory Visit: Payer: Self-pay | Admitting: Family

## 2018-01-29 DIAGNOSIS — C50919 Malignant neoplasm of unspecified site of unspecified female breast: Secondary | ICD-10-CM

## 2018-01-29 LAB — CANCER ANTIGEN 27.29: CA 27.29: 13 U/mL (ref 0.0–38.6)

## 2018-01-29 NOTE — Telephone Encounter (Signed)
Oral Chemotherapy Pharmacist Encounter  Patient Education I spoke with patient for overview of new oral chemotherapy medication: Ibrance (palbociclib) for the treatment of metastatic breast cancer, planned duration until disease progression or unacceptable drug toxicity.   Pt is doing well. Counseled patient on administration, dosing, side effects, monitoring, drug-food interactions, safe handling, storage, and disposal. Patient will take 1 capsule (100 mg total) by mouth daily with breakfast. Take for 21 days on, then 7 days off.  Side effects include but not limited to: myelosuppression, fatigue, nausea, diarrhea, decreased appetite, vomiting, alopecia.    Reviewed with patient importance of keeping a medication schedule and plan for any missed doses.  Ms. Roen voiced understanding and appreciation.  All questions answered. Medication handout was placed in the mail. Per [facility] medication will arrive on 2/12. Patient plans to get started when she receives the medication. Provider's office was notified of patient's plan to start her Ibrance.   Provided patient with Oral [place] [facility] phone number. Patient knows to call the office with questions or concerns. Oral Chemotherapy Navigation Clinic will continue to follow.  Darl Pikes, PharmD, BCPS Hematology/Oncology Clinical Pharmacist [facility] [place] [phone]  01/29/2018 3:31 PM

## 2018-01-29 NOTE — Progress Notes (Signed)
Referral MD  Reason for Referral: Metastatic breast cancer-new left breast mass-ER positive/PR positive/HER-2 negative  Chief Complaint  Patient presents with  . Follow-up  :  My cancer came back.  HPI: Nancy Herring is a very nice 62 year old postmenopausal African-American female.  I have known her for quite a while.  She has a long history of oligometastatic breast cancer.  She really had minimal bone metastasis.  She took herself off Femara.  She has been on no therapy for a couple years.  She recently did a self breast exam.  She noted a lump in her left breast.  She had a mammogram.  She had 2 suspicious masses.  There is no obvious axillary adenopathy.  An ultrasound of the left breast showed 2 irregular masses.  These were at the 4:30 position and measured 1.2 x 1 x 0.6 cm and the other measured 0.7 x 0.6 x 0.6 cm.  They are about 8 mm apart.  No suspicious left axillary adenopathy was noted.  She then underwent a biopsy.  This was done on January 20, 2018.  The pathology report ([id number]) showed both masses to be invasive ductal carcinoma.  Prognostic markers for both showed ER positive, PR positive, and HER-2 negative.  She has had no other symptoms.  She looks fantastic.  She feels good.  She is had no cough or shortness of breath.  She has had no nausea or vomiting.  She has had no bony pain.  She has had no change in bowel or bladder habits.  She has had no leg swelling.  She was subsequently referred to the [place] center for an evaluation.  Overall, performance status is ECOG 0.   Past Medical History:  Diagnosis Date  . Breast cancer ([place])    Metastatic to bone - followed by Dr. Marin Olp  . GERD (gastroesophageal reflux disease)   :  Past Surgical History:  Procedure Laterality Date  . BREAST LUMPECTOMY  2007 & 2009  :   Current Outpatient Medications:  .  Biotin 2.5 MG CAPS, Take by mouth every morning., Disp: , Rfl:  .  cholecalciferol  (VITAMIN D) 1000 UNITS tablet, Take 2,000 Units by mouth daily. , Disp: , Rfl:  .  Cyanocobalamin (VITAMIN B-12) 1000 MCG SUBL, Place under the tongue daily., Disp: , Rfl:  .  exemestane (AROMASIN) 25 MG tablet, Take 1 tablet (25 mg total) by mouth daily after breakfast., Disp: 90 tablet, Rfl: 3 .  Multiple Vitamin (MULTI-VITAMIN DAILY PO), Take by mouth every morning., Disp: , Rfl:  .  palbociclib (IBRANCE) 100 MG capsule, Take 1 capsule (100 mg total) by mouth daily with breakfast. Take for 21 days on, then 7 days off., Disp: 21 capsule, Rfl: 4:  :  No Known Allergies:  Family History  Problem Relation Age of Onset  . Diabetes Father        brother  . Hypertension Brother   . Crohn's disease Sister   . Heart disease Neg Hx   . Colon cancer Neg Hx   . Breast cancer Neg Hx   . Prostate cancer Neg Hx   :  Social History   Socioeconomic History  . Marital status: Married    Spouse name: Not on file  . Number of children: Not on file  . Years of education: Not on file  . Highest education level: Not on file  Social Needs  . Financial resource strain: Not on file  . Food insecurity -  worry: Not on file  . Food insecurity - inability: Not on file  . Transportation needs - medical: Not on file  . Transportation needs - non-medical: Not on file  Occupational History  . Not on file  Tobacco Use  . Smoking status: Never Smoker  . Smokeless tobacco: Never Used  Substance and Sexual Activity  . Alcohol use: No    Alcohol/week: 0.0 oz  . Drug use: No  . Sexual activity: Not on file  Other Topics Concern  . Not on file  Social History Narrative   Sister - Paul Half  :  Review of Systems  Constitutional: Negative.   HENT: Negative.   Eyes: Negative.   Respiratory: Negative.   Cardiovascular: Negative.   Gastrointestinal: Negative.   Genitourinary: Negative.   Musculoskeletal: Negative.   Skin: Negative.   Neurological: Negative.   Endo/Heme/Allergies: Negative.    Psychiatric/Behavioral: Negative.      Exam: Well-developed and well-nourished white female in no obvious distress.  Vital signs show temperature of 97.7.  Pulse 74.  Blood pressure 145/67.  Weight is 175 pounds.  Head neck exam shows no ocular or oral lesions.  There are no palpable cervical or supraclavicular lymph nodes.  Lungs are clear bilaterally.  Cardiac exam regular rate and rhythm with no murmurs, rubs or bruits.  Breast exam shows right breast no masses, edema or erythema.  There is no right axillary adenopathy.  Left breast shows the old lumpectomy scar in the 4 o'clock position.  She has a biopsy site which is healed.  No distinct masses noted in the left breast.  There is no left breast swelling.  There is no left breast erythema.  There is no left axillary adenopathy.  Abdomen is soft.  She has good bowel sounds.  There is no fluid wave.  There is no palpable liver or spleen tip.  Back exam shows no tenderness over the spine, ribs or hips.  Extremities shows no clubbing, cyanosis or edema.  She has good range of motion of her joints.  No lymphedema is noted in her left arm.  Skin exam shows no rashes, ecchymoses or petechia.  Neurological exam shows no focal neurological deficits. @IPVITALS @   Recent Labs    01/28/18 0815  WBC 4.6  HCT 37.9  PLT 155   Recent Labs    01/28/18 0815  NA 142  K 4.2  CL 107  CO2 27  GLUCOSE 108  BUN 14  CREATININE 1.02  CALCIUM 9.3    Blood smear review: None  Pathology: See above    Assessment and Plan: Nancy Herring is a very charming 63 year old African-American female.  She has a history of metastatic breast cancer.  Again, she has all go metastatic disease.  I think her only site of metastatic disease was in her spine.  She had been totally asymptomatic.  She had been on Femara which she stopped herself a couple years ago.  In my opinion, I would look at this as a new left breast primary.  It looks like this is multifocal  disease.  She does not want to have a mastectomy.  She has not had radiation to the left breast from her past breast cancer.  Given that she has ER positive disease, I think we can use antiestrogen therapy to try to shrink down the cancer.  If we can do this, then she might be able to have a lumpectomy.  I believe that she would be a good  candidate for Faslodex/Ibrance.  I think this would be a very good regimen for her.  She does have metastatic disease so this would be indicated.  I would like to get a MRI of her breast.  This would give [place] a better idea as to the size and spatial situation with her tumors.  I think a PET scan would also would be helpful to see how her metastatic disease looks.  I spent 60 minutes with her.  Over 50% of the time was spent face-to-face.  I was reviewing her pathology.  I went over my recommendations.  I answered her questions.  She will see surgery soon.  Again, she does not want to have a mastectomy.  We should be able to get good tumor shrinkage with antiestrogen therapy and then hopefully proceed with a lumpectomy and then radiation treatments.  I will plan to start the Faslodex next week.  Hopefully, we will get the [place] approved.  I will plan to see her back in 1 month.

## 2018-02-03 ENCOUNTER — Ambulatory Visit: Payer: 59 | Admitting: Adult Health

## 2018-02-03 DIAGNOSIS — Z0289 Encounter for other administrative examinations: Secondary | ICD-10-CM

## 2018-02-04 ENCOUNTER — Inpatient Hospital Stay: Payer: 59

## 2018-02-04 ENCOUNTER — Other Ambulatory Visit: Payer: 59

## 2018-02-04 NOTE — Telephone Encounter (Signed)
Oral Oncology Patient Advocate Encounter  Patients co-pay is [phone] for Ibrance.  [place] Patient Advocate [phone] 02/04/2018 2:14 PM

## 2018-02-05 ENCOUNTER — Ambulatory Visit (HOSPITAL_COMMUNITY)
Admission: RE | Admit: 2018-02-05 | Discharge: 2018-02-05 | Disposition: A | Discharge status: Home / Self Care | Payer: 59 | Source: Ambulatory Visit | Attending: Hematology & Oncology | Admitting: Hematology & Oncology

## 2018-02-05 DIAGNOSIS — C7951 Secondary malignant neoplasm of bone: Secondary | ICD-10-CM | POA: Diagnosis not present

## 2018-02-05 DIAGNOSIS — C50011 Malignant neoplasm of nipple and areola, right female breast: Secondary | ICD-10-CM | POA: Diagnosis present

## 2018-02-05 DIAGNOSIS — M899 Disorder of bone, unspecified: Secondary | ICD-10-CM | POA: Diagnosis not present

## 2018-02-05 DIAGNOSIS — N6320 Unspecified lump in the left breast, unspecified quadrant: Secondary | ICD-10-CM | POA: Diagnosis not present

## 2018-02-05 LAB — GLUCOSE, CAPILLARY: GLUCOSE-CAPILLARY: 84 mg/dL (ref 65–99)

## 2018-02-05 MED ORDER — FLUDEOXYGLUCOSE F - 18 (FDG) INJECTION
8.6000 | Freq: Once | INTRAVENOUS | Status: AC | PRN
  Administered 2018-02-05: 8.6 via INTRAVENOUS

## 2018-02-06 ENCOUNTER — Ambulatory Visit
Admission: RE | Admit: 2018-02-06 | Discharge: 2018-02-06 | Disposition: A | Discharge status: Home / Self Care | Payer: 59 | Source: Ambulatory Visit | Attending: Family | Admitting: Family

## 2018-02-06 DIAGNOSIS — C50919 Malignant neoplasm of unspecified site of unspecified female breast: Secondary | ICD-10-CM

## 2018-02-06 MED ORDER — GADOBENATE DIMEGLUMINE 529 MG/ML IV SOLN
16.0000 mL | Freq: Once | INTRAVENOUS | Status: AC | PRN
  Administered 2018-02-06: 16 mL via INTRAVENOUS

## 2018-02-08 ENCOUNTER — Other Ambulatory Visit: Payer: Self-pay | Admitting: *Deleted

## 2018-02-08 ENCOUNTER — Telehealth: Payer: Self-pay | Admitting: Hematology & Oncology

## 2018-02-08 DIAGNOSIS — C50919 Malignant neoplasm of unspecified site of unspecified female breast: Secondary | ICD-10-CM

## 2018-02-08 NOTE — Telephone Encounter (Signed)
Oral Oncology Patient Advocate Encounter  Called patient to make sure she had received her medication form [facility]. She has and she is doing fine.   We will continue to follow her.    [place] Patient Advocate [phone] 02/08/2018 11:05 AM

## 2018-02-09 ENCOUNTER — Other Ambulatory Visit: Payer: Self-pay

## 2018-02-09 ENCOUNTER — Inpatient Hospital Stay: Payer: 59

## 2018-02-09 ENCOUNTER — Inpatient Hospital Stay (HOSPITAL_BASED_OUTPATIENT_CLINIC_OR_DEPARTMENT_OTHER): Payer: 59 | Admitting: Hematology & Oncology

## 2018-02-09 ENCOUNTER — Encounter: Payer: Self-pay | Admitting: Hematology & Oncology

## 2018-02-09 VITALS — BP 139/76 | HR 71 | Temp 98.3°F | Resp 16 | Wt 176.0 lb

## 2018-02-09 DIAGNOSIS — C50919 Malignant neoplasm of unspecified site of unspecified female breast: Secondary | ICD-10-CM

## 2018-02-09 DIAGNOSIS — C50212 Malignant neoplasm of upper-inner quadrant of left female breast: Secondary | ICD-10-CM

## 2018-02-09 DIAGNOSIS — C50512 Malignant neoplasm of lower-outer quadrant of left female breast: Secondary | ICD-10-CM | POA: Diagnosis not present

## 2018-02-09 DIAGNOSIS — Z170 Estrogen receptor positive status [ER+]: Secondary | ICD-10-CM

## 2018-02-09 DIAGNOSIS — C7951 Secondary malignant neoplasm of bone: Secondary | ICD-10-CM | POA: Diagnosis not present

## 2018-02-09 LAB — CBC WITH DIFFERENTIAL (CANCER CENTER ONLY)
BASOS ABS: 0 10*3/uL (ref 0.0–0.1)
Basophils Relative: 0 %
Eosinophils Absolute: 0.2 10*3/uL (ref 0.0–0.5)
Eosinophils Relative: 4 %
HCT: 39 % (ref 34.8–46.6)
HEMOGLOBIN: 12.8 g/dL (ref 11.6–15.9)
LYMPHS PCT: 39 %
Lymphs Abs: 1.7 10*3/uL (ref 0.9–3.3)
MCH: 28.2 pg (ref 26.0–34.0)
MCHC: 32.8 g/dL (ref 32.0–36.0)
MCV: 85.9 fL (ref 81.0–101.0)
MONO ABS: 0.4 10*3/uL (ref 0.1–0.9)
Monocytes Relative: 10 %
NEUTROS ABS: 2 10*3/uL (ref 1.5–6.5)
Neutrophils Relative %: 47 %
Platelet Count: 164 10*3/uL (ref 145–400)
RBC: 4.54 MIL/uL (ref 3.70–5.32)
RDW: 12.9 % (ref 11.1–15.7)
WBC Count: 4.3 10*3/uL (ref 3.9–10.0)

## 2018-02-09 LAB — CMP (CANCER CENTER ONLY)
ALT: 25 U/L (ref 10–47)
ANION GAP: 10 (ref 5–15)
AST: 23 U/L (ref 11–38)
Albumin: 3.7 g/dL (ref 3.5–5.0)
Alkaline Phosphatase: 175 U/L — ABNORMAL HIGH (ref 26–84)
BILIRUBIN TOTAL: 0.9 mg/dL (ref 0.2–1.6)
BUN: 15 mg/dL (ref 7–22)
CALCIUM: 9.8 mg/dL (ref 8.0–10.3)
CO2: 29 mmol/L (ref 18–33)
Chloride: 106 mmol/L (ref 98–108)
Creatinine: 0.9 mg/dL (ref 0.60–1.20)
Glucose, Bld: 112 mg/dL (ref 73–118)
Potassium: 4.5 mmol/L (ref 3.3–4.7)
Sodium: 145 mmol/L (ref 128–145)
Total Protein: 7.8 g/dL (ref 6.4–8.1)

## 2018-02-09 MED ORDER — FULVESTRANT 250 MG/5ML IM SOLN
INTRAMUSCULAR | Status: AC
  Filled 2018-02-09: qty 5

## 2018-02-09 NOTE — Progress Notes (Signed)
Hematology and Oncology Follow Up Visit  Nancy Herring [id number] 01/21/1956 62 y.o. 02/09/2018   Principle Diagnosis:   Metastatic breast cancer-ER positive/PR positive/HER-2 negative  Current Therapy:    Observation     Interim History:  Nancy Herring is back for an early visit.  Unfortunately, she does not want to take treatment yet.  We did go ahead and do our scans.  We did a PET scan on her.  This was done on February 15.  This did confirm the mass in the left breast.  It was 8 mm.  Had an SUV of 4.  She had diffuse sclerotic bony metastases.  Most were not hyperactive.  She did have a lesion in the right 11th rib that was metabolic with SUV of 6.5.  She has a T10 lesion that is metabolic with an SUV of 6.3.  A right-sided sacral lesion is metabolic with an SUV of 6.2.  A left sacral lesion is no longer metabolic.  She has an appointment with the surgeon on Friday.  I told her that she has active disease now.  In the past, her disease really has not been that active.  She had MRI of the breast.  MRI of the right breast was normal.  MRI of the left breast showed 2 adjacent lesions.  One measured 1 x 0.7 x 0.5 cm.  The other measured 1.2 x 0.5 x 0.9 cm.  No obvious adenopathy was noted.  The combined size of the 2 lesions was 3.2 cm.  Her last CA-27-29 was actually stable at 13.  I have talked to her before regarding treatment.  She really does not want surgery.  She does not want to have a mastectomy.  She does not want radiation.  I thought that we could use Faslodex/Ibrance to help with her breast lesion.  I think we could shrink it with the Faslodex and Ibrance.  Unfortunately, she does not wish to take Ibrance.  She apparent was called by the company and got very concerned over all of the side effects of a talk to her about.  She also wants to hold off on Faslodex.  She says she will take this next week after she sees the surgeon.  I told her that studies have shown that  the combination of Faslodex/Ibrance have doubled the survival for women with metastatic breast cancer.  I told her that if she just did Faslodex by itself, she would be compromising her future.  She understands this.  She is led by the Lord which I have no problems with.  I does want her to understand that she is certainly not going along with what would be considered standard of care.  I also talked her about Xgeva for the bone metastases.  I think Xgeva would be very helpful.  I told her it would be a subcutaneous injection every 3 months.  I do not envision any side effects from this.  I told her that it definitely helps with preventing complications from bony metastasis.  She will think about this.  Otherwise, she is actually feeling pretty well.  Her performance status is ECOG 1.  Medications:  Current Outpatient Medications:  .  Biotin 2.5 MG CAPS, Take by mouth every morning., Disp: , Rfl:  .  cholecalciferol (VITAMIN D) 1000 UNITS tablet, Take 2,000 Units by mouth daily. , Disp: , Rfl:  .  Cyanocobalamin (VITAMIN B-12) 1000 MCG SUBL, Place under the tongue daily., Disp: , Rfl:  .    exemestane (AROMASIN) 25 MG tablet, Take 1 tablet (25 mg total) by mouth daily after breakfast., Disp: 90 tablet, Rfl: 3 .  Multiple Vitamin (MULTI-VITAMIN DAILY PO), Take by mouth every morning., Disp: , Rfl:  .  palbociclib (IBRANCE) 100 MG capsule, Take 1 capsule (100 mg total) by mouth daily with breakfast. Take for 21 days on, then 7 days off., Disp: 21 capsule, Rfl: 4  Allergies: No Known Allergies  Past Medical History, Surgical history, Social history, and Family History were reviewed and updated.  Review of Systems: Review of Systems  Constitutional: Negative.   HENT:  Negative.   Eyes: Negative.   Respiratory: Negative.   Cardiovascular: Negative.   Gastrointestinal: Negative.   Endocrine: Negative.   Genitourinary: Negative.    Musculoskeletal: Negative.   Skin: Negative.   Neurological:  Negative.   Hematological: Negative.   Psychiatric/Behavioral: Negative.     Physical Exam:  weight is 176 lb (79.8 kg). Her oral temperature is 98.3 F (36.8 C). Her blood pressure is 139/76 and her pulse is 71. Her respiration is 16 and oxygen saturation is 100%.   Wt Readings from Last 3 Encounters:  02/09/18 176 lb (79.8 kg)  01/28/18 175 lb (79.4 kg)  01/01/18 174 lb (78.9 kg)    Physical Exam  Constitutional: She is oriented to person, place, and time.  HENT:  Head: Normocephalic and atraumatic.  Mouth/Throat: Oropharynx is clear and moist.  Eyes: EOM are normal. Pupils are equal, round, and reactive to light.  Neck: Normal range of motion.  Cardiovascular: Normal rate, regular rhythm and normal heart sounds.  Pulmonary/Chest: Effort normal and breath sounds normal.  Abdominal: Soft. Bowel sounds are normal.  Musculoskeletal: Normal range of motion. She exhibits no edema, tenderness or deformity.  Lymphadenopathy:    She has no cervical adenopathy.  Neurological: She is alert and oriented to person, place, and time.  Skin: Skin is warm and dry. No rash noted. No erythema.  Psychiatric: She has a normal mood and affect. Her behavior is normal. Judgment and thought content normal.  Vitals reviewed.    Lab Results  Component Value Date   WBC 4.3 02/09/2018   HGB 13.0 08/15/2015   HCT 39.0 02/09/2018   MCV 85.9 02/09/2018   PLT 164 02/09/2018     Chemistry      Component Value Date/Time   NA 145 02/09/2018 1032   NA 142 11/08/2013 1008   K 4.5 02/09/2018 1032   K 3.9 11/08/2013 1008   CL 106 02/09/2018 1032   CL 103 11/08/2013 1008   CO2 29 02/09/2018 1032   CO2 29 11/08/2013 1008   BUN 15 02/09/2018 1032   BUN 11 11/08/2013 1008   CREATININE 0.90 02/09/2018 1032   CREATININE 1.4 (H) 11/08/2013 1008      Component Value Date/Time   CALCIUM 9.8 02/09/2018 1032   CALCIUM 9.6 11/08/2013 1008   ALKPHOS 175 (H) 02/09/2018 1032   ALKPHOS 124 (H) 11/08/2013  1008   AST 23 02/09/2018 1032   ALT 25 02/09/2018 1032   ALT 25 11/08/2013 1008   BILITOT 0.9 02/09/2018 1032       Impression and Plan: Nancy Herring is a 61-year-old postmenopausal Afro-American female.  She has known metastatic disease.  I had not seen her for about 3 years and then she was found to have a lump in her left breast.  I believe that this breast cancer is probably a new primary in the breast.  I realize   that she does have metastatic disease.  Again, I am trying to get her to take aggressive therapy so that we can shrink this mass in the left breast so she will not need a lumpectomy.  I believe that Faslodex with Ibrance is the best way of accomplishing this.  We will have to see what the surgeon has to say.  Hopefully, she will get back with us after she sees the surgeon to let us know when she wants to start Faslodex.  Again, I made her realize that by not doing Faslodex with Ibrance, that she is compromising her prognosis and that this could potentially shorten her life span.  She does fully understand this.  She just has a peace in only doing Faslodex.  I spent about 45 minutes with her.  Over 50% of the time was face-to-face talking to her about her scan results, and trying to talk with her about her decisions regarding treatment.     Peter R Ennever, MD 2/19/201911:54 AM 

## 2018-02-11 ENCOUNTER — Encounter: Payer: Self-pay | Admitting: Adult Health

## 2018-02-11 ENCOUNTER — Ambulatory Visit (INDEPENDENT_AMBULATORY_CARE_PROVIDER_SITE_OTHER): Payer: 59 | Admitting: Adult Health

## 2018-02-11 VITALS — BP 126/80 | Temp 98.0°F | Wt 177.0 lb

## 2018-02-11 DIAGNOSIS — Z7689 Persons encountering health services in other specified circumstances: Secondary | ICD-10-CM | POA: Diagnosis not present

## 2018-02-11 DIAGNOSIS — C50212 Malignant neoplasm of upper-inner quadrant of left female breast: Secondary | ICD-10-CM | POA: Diagnosis not present

## 2018-02-11 DIAGNOSIS — Z170 Estrogen receptor positive status [ER+]: Secondary | ICD-10-CM | POA: Diagnosis not present

## 2018-02-11 NOTE — Progress Notes (Signed)
Patient presents to clinic today to establish care. She is a pleasant 62 year old female who  has a past medical history of Breast cancer ([place]) and GERD (gastroesophageal reflux disease).   Acute Concerns: Establish Care   Chronic Issues: Breast Cancer-first diagnosed in 2009 with breast cancer of left breast for which she had a lumpectomy and appears to be treated with Femara.  Most recently she did a self breast exam and noticed a lump in her left breast she had a mammogram and had 2 suspicious masses.  There is no obvious axillary adenopathy. ultrasound of left breast showed 2 irregular irregular masses.  She then underwent a biopsy which showed both masses to be invasive ductal carcinoma.  Is following up with Dr. Marin Olp and her last appointment was on 02/09/2018.  She had a PET scan done on February 15 this did confirm the mass in the left breast, it was 8 mm . She had diffuse sclerotic bony metastases.  Treatment is discussed.  She does not want surgery, she does not want to have a mastectomy and she does not want radiation.  She has an appointment with surgeon tomorrow which will help her navigate which way she wants to go with treatment.  Health Maintenance: Dental -- Does not do routine care  Vision -- Does not do routine care  Immunizations -- UTD  Colonoscopy -- 2009  PAP -- 2017  Diet: Heart healthy diet  Exercise: Has not been exercising recently  Is followed by: Dr. Gertie Fey    Past Medical History:  Diagnosis Date  . Breast cancer ([place])    Metastatic to bone - followed by Dr. Marin Olp  . GERD (gastroesophageal reflux disease)     Past Surgical History:  Procedure Laterality Date  . BREAST LUMPECTOMY  2007 & 2009    Current Outpatient Medications on File Prior to Visit  Medication Sig Dispense Refill  . Biotin 2.5 MG CAPS Take by mouth every morning.    . cholecalciferol (VITAMIN D) 1000 UNITS tablet Take 2,000 Units by mouth daily.     . Cyanocobalamin (VITAMIN  B-12) 1000 MCG SUBL Place under the tongue daily.    . Multiple Vitamin (MULTI-VITAMIN DAILY PO) Take by mouth every morning.    . palbociclib (IBRANCE) 100 MG capsule Take 1 capsule (100 mg total) by mouth daily with breakfast. Take for 21 days on, then 7 days off. (Patient not taking: Reported on 02/11/2018) 21 capsule 4   No current facility-administered medications on file prior to visit.     No Known Allergies  Family History  Problem Relation Age of Onset  . Diabetes Father        brother  . Hypertension Brother   . Crohn's disease Sister   . Heart disease Neg Hx   . Colon cancer Neg Hx   . Breast cancer Neg Hx   . Prostate cancer Neg Hx     Social History   Socioeconomic History  . Marital status: Married    Spouse name: Not on file  . Number of children: Not on file  . Years of education: Not on file  . Highest education level: Not on file  Social Needs  . Financial resource strain: Not on file  . Food insecurity - worry: Not on file  . Food insecurity - inability: Not on file  . Transportation needs - medical: Not on file  . Transportation needs - non-medical: Not on file  Occupational History  .  Not on file  Tobacco Use  . Smoking status: Never Smoker  . Smokeless tobacco: Never Used  Substance and Sexual Activity  . Alcohol use: No    Alcohol/week: 0.0 oz  . Drug use: No  . Sexual activity: Not on file  Other Topics Concern  . Not on file  Social History Narrative   Sister - Paul Half    Review of Systems  Constitutional: Negative.   HENT: Negative.   Eyes: Negative.   Respiratory: Negative.   Cardiovascular: Negative.   Gastrointestinal: Negative.   Genitourinary: Negative.   Musculoskeletal: Negative.   Skin: Negative.   Neurological: Negative.   Endo/Heme/Allergies: Negative.   Psychiatric/Behavioral: Negative.   All other systems reviewed and are negative.   BP 126/80 (BP Location: Left Arm)   Temp 98 F (36.7 C) (Oral)   Wt 177  lb (80.3 kg)   BMI 26.91 kg/m   Physical Exam  Constitutional: She is oriented to person, place, and time and well-developed, well-nourished, and in no distress. No distress.  HENT:  Head: Normocephalic and atraumatic.  Right Ear: External ear normal.  Left Ear: External ear normal.  Nose: Nose normal.  Mouth/Throat: Oropharynx is clear and moist. No oropharyngeal exudate.  Eyes: Conjunctivae and EOM are normal. Pupils are equal, round, and reactive to light. Right eye exhibits no discharge. Left eye exhibits no discharge. No scleral icterus.  Neck: Normal range of motion. Neck supple. No thyromegaly present.  Cardiovascular: Normal rate, regular rhythm and intact distal pulses. Exam reveals no gallop and no friction rub.  Murmur heard.  Systolic murmur is present with a grade of 2/6. Pulmonary/Chest: Effort normal and breath sounds normal. No respiratory distress. She has no wheezes. She has no rales. She exhibits no tenderness.  Abdominal: Soft. Bowel sounds are normal. She exhibits no distension and no mass. There is no tenderness. There is no rebound and no guarding.  Musculoskeletal: Normal range of motion. She exhibits no edema, tenderness or deformity.  Lymphadenopathy:    She has no cervical adenopathy.  Neurological: She is alert and oriented to person, place, and time. Gait normal. GCS score is 15.  Skin: Skin is warm and dry. No rash noted. She is not diaphoretic. No erythema. No pallor.  Psychiatric: Mood, memory, affect and judgment normal.  Nursing note and vitals reviewed.   Recent Results (from the past 2160 hour(s))  CA 27.29     Status: None   Collection Time: 01/28/18  8:15 AM  Result Value Ref Range   CA 27.29 13.0 0.0 - 38.6 U/mL    Comment: (NOTE) Siemens Centaur Immunochemiluminometric Methodology (ICMA) Values obtained with different assay methods or kits cannot be used interchangeably. Results cannot be interpreted as absolute evidence of the presence or  absence of malignant disease. Performed At: [facility] [place], [place] [id number] Rush Farmer MD VE:[id number] Performed at [facility] at [facility], [address], [place], [place] [address]   CBC with Differential ([place] Only)     Status: None   Collection Time: 01/28/18  8:15 AM  Result Value Ref Range   WBC Count 4.6 3.9 - 10.0 K/uL   RBC 4.45 3.70 - 5.32 MIL/uL   Hemoglobin 12.4 11.6 - 15.9 g/dL   HCT 37.9 34.8 - 46.6 %   MCV 85.2 81.0 - 101.0 fL   MCH 27.9 26.0 - 34.0 pg   MCHC 32.7 32.0 - 36.0 g/dL  RDW 12.6 11.1 - 15.7 %   Platelet Count 155 145 - 400 K/uL   Neutrophils Relative % 53 %   Neutro Abs 2.4 1.5 - 6.5 K/uL   Lymphocytes Relative 36 %   Lymphs Abs 1.6 0.9 - 3.3 K/uL   Monocytes Relative 8 %   Monocytes Absolute 0.3 0.1 - 0.9 K/uL   Eosinophils Relative 3 %   Eosinophils Absolute 0.2 0.0 - 0.5 K/uL   Basophils Relative 0 %   Basophils Absolute 0.0 0.0 - 0.1 K/uL    Comment: Performed at [facility] at [facility], [address] [place], [place] [address]  CMP ([place] only)     Status: Abnormal   Collection Time: 01/28/18  8:15 AM  Result Value Ref Range   Sodium 142 136 - 145 mmol/L   Potassium 4.2 3.5 - 5.1 mmol/L   Chloride 107 98 - 109 mmol/L   CO2 27 22 - 29 mmol/L   Glucose, Bld 108 70 - 140 mg/dL   BUN 14 7 - 26 mg/dL   Creatinine 1.02 0.60 - 1.10 mg/dL   Calcium 9.3 8.4 - 10.4 mg/dL   Total Protein 7.2 6.4 - 8.3 g/dL   Albumin 3.8 3.5 - 5.0 g/dL   AST 33 5 - 34 U/L   ALT 38 0 - 55 U/L   Alkaline Phosphatase 185 (H) 40 - 150 U/L   Total Bilirubin 0.4 0.2 - 1.2 mg/dL   GFR, Est Non Af Am 58 (L) >60 mL/min   GFR, Est AFR Am >60 >60 mL/min    Comment: (NOTE) The eGFR has been calculated using the CKD EPI equation. This calculation has not been validated in all clinical situations. eGFR's persistently <60 mL/min signify possible Chronic  Kidney Disease.    Anion gap 8 3 - 11    Comment: Performed at [facility], [address] [place], [place] [address]  Glucose, capillary     Status: None   Collection Time: 02/05/18  2:01 PM  Result Value Ref Range   Glucose-Capillary 84 65 - 99 mg/dL  CBC with Differential ([place] Only)     Status: None   Collection Time: 02/09/18 10:32 AM  Result Value Ref Range   WBC Count 4.3 3.9 - 10.0 K/uL   RBC 4.54 3.70 - 5.32 MIL/uL   Hemoglobin 12.8 11.6 - 15.9 g/dL   HCT 39.0 34.8 - 46.6 %   MCV 85.9 81.0 - 101.0 fL   MCH 28.2 26.0 - 34.0 pg   MCHC 32.8 32.0 - 36.0 g/dL   RDW 12.9 11.1 - 15.7 %   Platelet Count 164 145 - 400 K/uL   Neutrophils Relative % 47 %   Neutro Abs 2.0 1.5 - 6.5 K/uL   Lymphocytes Relative 39 %   Lymphs Abs 1.7 0.9 - 3.3 K/uL   Monocytes Relative 10 %   Monocytes Absolute 0.4 0.1 - 0.9 K/uL   Eosinophils Relative 4 %   Eosinophils Absolute 0.2 0.0 - 0.5 K/uL   Basophils Relative 0 %   Basophils Absolute 0.0 0.0 - 0.1 K/uL    Comment: Performed at [facility] at [facility], [address] [place], [place] [address]  CMP ([place] only)     Status: Abnormal   Collection Time: 02/09/18 10:32 AM  Result Value Ref Range   Sodium 145 128 - 145 mmol/L   Potassium 4.5  3.3 - 4.7 mmol/L   Chloride 106 98 - 108 mmol/L   CO2 29 18 - 33 mmol/L   Glucose, Bld 112 73 - 118 mg/dL   BUN 15 7 - 22 mg/dL   Creatinine 0.90 0.60 - 1.20 mg/dL   Calcium 9.8 8.0 - 10.3 mg/dL   Total Protein 7.8 6.4 - 8.1 g/dL   Albumin 3.7 3.5 - 5.0 g/dL   AST 23 11 - 38 U/L   ALT 25 10 - 47 U/L   Alkaline Phosphatase 175 (H) 26 - 84 U/L   Total Bilirubin 0.9 0.2 - 1.6 mg/dL   Anion gap 10 5 - 15    Comment: Performed at [facility] at [facility], [address] [place], [place] [address]    Assessment/Plan: 1. Encounter to establish care -Follow-up as needed.  I will provide supportive  care for her as needed while she undergoes treatment for breast cancer.  She knows to contact me if she needs anything  2. Malignant neoplasm of upper-inner quadrant of left breast in female, estrogen receptor positive ([place]) - Will await plan of care  Dorothyann Peng, NP

## 2018-02-11 NOTE — Patient Instructions (Signed)
It was great seeing you today   Please let me know if you need anything. I will keep an eye out on the plan with oncology and surgery

## 2018-03-03 ENCOUNTER — Other Ambulatory Visit: Payer: 59

## 2018-03-03 ENCOUNTER — Ambulatory Visit: Payer: 59 | Admitting: Hematology & Oncology

## 2018-03-16 ENCOUNTER — Inpatient Hospital Stay: Payer: 59 | Attending: Hematology & Oncology

## 2018-03-16 ENCOUNTER — Inpatient Hospital Stay: Payer: 59 | Admitting: Family

## 2018-03-24 ENCOUNTER — Ambulatory Visit: Payer: 59

## 2018-03-25 ENCOUNTER — Other Ambulatory Visit: Payer: Self-pay | Admitting: *Deleted

## 2018-03-25 ENCOUNTER — Inpatient Hospital Stay: Payer: 59 | Attending: Hematology & Oncology

## 2018-03-25 DIAGNOSIS — Z5111 Encounter for antineoplastic chemotherapy: Secondary | ICD-10-CM | POA: Insufficient documentation

## 2018-03-25 DIAGNOSIS — C50512 Malignant neoplasm of lower-outer quadrant of left female breast: Secondary | ICD-10-CM | POA: Insufficient documentation

## 2018-03-25 DIAGNOSIS — C7951 Secondary malignant neoplasm of bone: Secondary | ICD-10-CM | POA: Insufficient documentation

## 2018-03-25 MED ORDER — FULVESTRANT 250 MG/5ML IM SOLN: 500.0000 mg | INTRAMUSCULAR | 11 refills | Status: AC

## 2018-03-29 ENCOUNTER — Ambulatory Visit: Payer: 59

## 2018-03-29 ENCOUNTER — Other Ambulatory Visit: Payer: 59

## 2018-03-29 ENCOUNTER — Ambulatory Visit: Payer: 59 | Admitting: Hematology & Oncology

## 2018-04-05 ENCOUNTER — Encounter: Payer: Self-pay | Admitting: Hematology & Oncology

## 2018-04-05 ENCOUNTER — Inpatient Hospital Stay: Payer: 59

## 2018-04-05 ENCOUNTER — Inpatient Hospital Stay (HOSPITAL_BASED_OUTPATIENT_CLINIC_OR_DEPARTMENT_OTHER): Payer: 59 | Admitting: Hematology & Oncology

## 2018-04-05 ENCOUNTER — Other Ambulatory Visit: Payer: Self-pay

## 2018-04-05 VITALS — BP 135/68 | HR 80 | Temp 97.6°F | Resp 18 | Wt 176.0 lb

## 2018-04-05 DIAGNOSIS — C50011 Malignant neoplasm of nipple and areola, right female breast: Secondary | ICD-10-CM

## 2018-04-05 DIAGNOSIS — C50512 Malignant neoplasm of lower-outer quadrant of left female breast: Secondary | ICD-10-CM

## 2018-04-05 DIAGNOSIS — R0781 Pleurodynia: Secondary | ICD-10-CM

## 2018-04-05 DIAGNOSIS — C7951 Secondary malignant neoplasm of bone: Secondary | ICD-10-CM | POA: Diagnosis present

## 2018-04-05 DIAGNOSIS — C50012 Malignant neoplasm of nipple and areola, left female breast: Principal | ICD-10-CM

## 2018-04-05 DIAGNOSIS — Z5111 Encounter for antineoplastic chemotherapy: Secondary | ICD-10-CM | POA: Diagnosis present

## 2018-04-05 DIAGNOSIS — Z170 Estrogen receptor positive status [ER+]: Principal | ICD-10-CM

## 2018-04-05 DIAGNOSIS — M549 Dorsalgia, unspecified: Secondary | ICD-10-CM

## 2018-04-05 DIAGNOSIS — C50919 Malignant neoplasm of unspecified site of unspecified female breast: Secondary | ICD-10-CM

## 2018-04-05 LAB — CMP (CANCER CENTER ONLY)
ALK PHOS: 165 U/L — AB (ref 26–84)
ALT: 22 U/L (ref 10–47)
ANION GAP: 8 (ref 5–15)
AST: 24 U/L (ref 11–38)
Albumin: 3.8 g/dL (ref 3.5–5.0)
BUN: 11 mg/dL (ref 7–22)
CHLORIDE: 108 mmol/L (ref 98–108)
CO2: 30 mmol/L (ref 18–33)
Calcium: 9.1 mg/dL (ref 8.0–10.3)
Creatinine: 1.2 mg/dL (ref 0.60–1.20)
Glucose, Bld: 130 mg/dL — ABNORMAL HIGH (ref 73–118)
POTASSIUM: 3.8 mmol/L (ref 3.3–4.7)
SODIUM: 146 mmol/L — AB (ref 128–145)
TOTAL PROTEIN: 7.4 g/dL (ref 6.4–8.1)
Total Bilirubin: 0.7 mg/dL (ref 0.2–1.6)

## 2018-04-05 LAB — CBC WITH DIFFERENTIAL (CANCER CENTER ONLY)
Basophils Absolute: 0 10*3/uL (ref 0.0–0.1)
Basophils Relative: 0 %
EOS ABS: 0.2 10*3/uL (ref 0.0–0.5)
EOS PCT: 4 %
HCT: 38.4 % (ref 34.8–46.6)
HEMOGLOBIN: 12.7 g/dL (ref 11.6–15.9)
LYMPHS ABS: 1.6 10*3/uL (ref 0.9–3.3)
LYMPHS PCT: 35 %
MCH: 27.9 pg (ref 26.0–34.0)
MCHC: 33.1 g/dL (ref 32.0–36.0)
MCV: 84.4 fL (ref 81.0–101.0)
Monocytes Absolute: 0.4 10*3/uL (ref 0.1–0.9)
Monocytes Relative: 8 %
Neutro Abs: 2.4 10*3/uL (ref 1.5–6.5)
Neutrophils Relative %: 53 %
PLATELETS: 160 10*3/uL (ref 145–400)
RBC: 4.55 MIL/uL (ref 3.70–5.32)
RDW: 12.6 % (ref 11.1–15.7)
WBC Count: 4.6 10*3/uL (ref 3.9–10.0)

## 2018-04-05 MED ORDER — FULVESTRANT 250 MG/5ML IM SOLN
500.0000 mg | INTRAMUSCULAR | Status: DC
  Administered 2018-04-05: 500 mg via INTRAMUSCULAR

## 2018-04-05 MED ORDER — DENOSUMAB 120 MG/1.7ML ~~LOC~~ SOLN
SUBCUTANEOUS | Status: AC
  Filled 2018-04-05: qty 1.7

## 2018-04-05 MED ORDER — DENOSUMAB 120 MG/1.7ML ~~LOC~~ SOLN
120.0000 mg | Freq: Once | SUBCUTANEOUS | Status: AC
  Administered 2018-04-05: 120 mg via SUBCUTANEOUS

## 2018-04-05 MED ORDER — FULVESTRANT 250 MG/5ML IM SOLN
INTRAMUSCULAR | Status: AC
  Filled 2018-04-05: qty 10

## 2018-04-05 NOTE — Patient Instructions (Signed)
Fulvestrant injection What is this medicine? FULVESTRANT (ful VES trant) blocks the effects of estrogen. It is used to treat breast cancer. This medicine may be used for other purposes; ask your health care provider or pharmacist if you have questions. COMMON BRAND NAME(S): FASLODEX What should I tell my health care provider before I take this medicine? They need to know if you have any of these conditions: -bleeding problems -liver disease -low levels of platelets in the blood -an unusual or allergic reaction to fulvestrant, other medicines, foods, dyes, or preservatives -pregnant or trying to get pregnant -breast-feeding How should I use this medicine? This medicine is for injection into a muscle. It is usually given by a health care professional in a hospital or clinic setting. Talk to your pediatrician regarding the use of this medicine in children. Special care may be needed. Overdosage: If you think you have taken too much of this medicine contact a poison control center or emergency room at once. NOTE: This medicine is only for you. Do not share this medicine with others. What if I miss a dose? It is important not to miss your dose. Call your doctor or health care professional if you are unable to keep an appointment. What may interact with this medicine? -medicines that treat or prevent blood clots like warfarin, enoxaparin, and dalteparin This list may not describe all possible interactions. Give your health care provider a list of all the medicines, herbs, non-prescription drugs, or dietary supplements you use. Also tell them if you smoke, drink alcohol, or use illegal drugs. Some items may interact with your medicine. What should I watch for while using this medicine? Your condition will be monitored carefully while you are receiving this medicine. You will need important blood work done while you are taking this medicine. Do not become pregnant while taking this medicine or for  at least 1 year after stopping it. Women of child-bearing potential will need to have a negative pregnancy test before starting this medicine. Women should inform their doctor if they wish to become pregnant or think they might be pregnant. There is a potential for serious side effects to an unborn child. Men should inform their doctors if they wish to father a child. This medicine may lower sperm counts. Talk to your health care professional or pharmacist for more information. Do not breast-feed an infant while taking this medicine or for 1 year after the last dose. What side effects may I notice from receiving this medicine? Side effects that you should report to your doctor or health care professional as soon as possible: -allergic reactions like skin rash, itching or hives, swelling of the face, lips, or tongue -feeling faint or lightheaded, falls -pain, tingling, numbness, or weakness in the legs -signs and symptoms of infection like fever or chills; cough; flu-like symptoms; sore throat -vaginal bleeding Side effects that usually do not require medical attention (report to your doctor or health care professional if they continue or are bothersome): -aches, pains -constipation -diarrhea -headache -hot flashes -nausea, vomiting -pain at site where injected -stomach pain This list may not describe all possible side effects. Call your doctor for medical advice about side effects. You may report side effects to FDA at 1-800-FDA-1088. Where should I keep my medicine? This drug is given in a hospital or clinic and will not be stored at home. NOTE: This sheet is a summary. It may not cover all possible information. If you have questions about this medicine, talk to your   doctor, pharmacist, or health care provider.  2018 Elsevier/Gold Standard (2015-07-06 11:03:55) Denosumab injection What is this medicine? DENOSUMAB (den oh sue mab) slows bone breakdown. Prolia is used to treat osteoporosis in  women after menopause and in men. Delton See is used to treat a high calcium level due to cancer and to prevent bone fractures and other bone problems caused by multiple myeloma or cancer bone metastases. Delton See is also used to treat giant cell tumor of the bone. This medicine may be used for other purposes; ask your health care provider or pharmacist if you have questions. COMMON BRAND NAME(S): Prolia, XGEVA What should I tell my health care provider before I take this medicine? They need to know if you have any of these conditions: -dental disease -having surgery or tooth extraction -infection -kidney disease -low levels of calcium or Vitamin D in the blood -malnutrition -on hemodialysis -skin conditions or sensitivity -thyroid or parathyroid disease -an unusual reaction to denosumab, other medicines, foods, dyes, or preservatives -pregnant or trying to get pregnant -breast-feeding How should I use this medicine? This medicine is for injection under the skin. It is given by a health care professional in a hospital or clinic setting. If you are getting Prolia, a special MedGuide will be given to you by the pharmacist with each prescription and refill. Be sure to read this information carefully each time. For Prolia, talk to your pediatrician regarding the use of this medicine in children. Special care may be needed. For Delton See, talk to your pediatrician regarding the use of this medicine in children. While this drug may be prescribed for children as young as 13 years for selected conditions, precautions do apply. Overdosage: If you think you have taken too much of this medicine contact a poison control center or emergency room at once. NOTE: This medicine is only for you. Do not share this medicine with others. What if I miss a dose? It is important not to miss your dose. Call your doctor or health care professional if you are unable to keep an appointment. What may interact with this  medicine? Do not take this medicine with any of the following medications: -other medicines containing denosumab This medicine may also interact with the following medications: -medicines that lower your chance of fighting infection -steroid medicines like prednisone or cortisone This list may not describe all possible interactions. Give your health care provider a list of all the medicines, herbs, non-prescription drugs, or dietary supplements you use. Also tell them if you smoke, drink alcohol, or use illegal drugs. Some items may interact with your medicine. What should I watch for while using this medicine? Visit your doctor or health care professional for regular checks on your progress. Your doctor or health care professional may order blood tests and other tests to see how you are doing. Call your doctor or health care professional for advice if you get a fever, chills or sore throat, or other symptoms of a cold or flu. Do not treat yourself. This drug may decrease your body's ability to fight infection. Try to avoid being around people who are sick. You should make sure you get enough calcium and vitamin D while you are taking this medicine, unless your doctor tells you not to. Discuss the foods you eat and the vitamins you take with your health care professional. See your dentist regularly. Brush and floss your teeth as directed. Before you have any dental work done, tell your dentist you are receiving this medicine. Do  not become pregnant while taking this medicine or for 5 months after stopping it. Talk with your doctor or health care professional about your birth control options while taking this medicine. Women should inform their doctor if they wish to become pregnant or think they might be pregnant. There is a potential for serious side effects to an unborn child. Talk to your health care professional or pharmacist for more information. What side effects may I notice from receiving this  medicine? Side effects that you should report to your doctor or health care professional as soon as possible: -allergic reactions like skin rash, itching or hives, swelling of the face, lips, or tongue -bone pain -breathing problems -dizziness -jaw pain, especially after dental work -redness, blistering, peeling of the skin -signs and symptoms of infection like fever or chills; cough; sore throat; pain or trouble passing urine -signs of low calcium like fast heartbeat, muscle cramps or muscle pain; pain, tingling, numbness in the hands or feet; seizures -unusual bleeding or bruising -unusually weak or tired Side effects that usually do not require medical attention (report to your doctor or health care professional if they continue or are bothersome): -constipation -diarrhea -headache -joint pain -loss of appetite -muscle pain -runny nose -tiredness -upset stomach This list may not describe all possible side effects. Call your doctor for medical advice about side effects. You may report side effects to FDA at 1-800-FDA-1088. Where should I keep my medicine? This medicine is only given in a clinic, doctor's office, or other health care setting and will not be stored at home. NOTE: This sheet is a summary. It may not cover all possible information. If you have questions about this medicine, talk to your doctor, pharmacist, or health care provider.  2018 Elsevier/Gold Standard (2016-12-30 19:17:21)  

## 2018-04-05 NOTE — Progress Notes (Signed)
Hematology and Oncology Follow Up Visit  Nancy Herring [id number] 1956/04/04 62 y.o. 04/05/2018   Principle Diagnosis:   Metastatic breast cancer-ER positive/PR positive/HER-2 negative  Current Therapy:    Faslodex/Ibrance - cycle #1 to start on 04/05/2018  Xgeva - 120 mg SQ q 3 months -- next dose 06/2018     Interim History:  Nancy Herring is back for follow-up.  She had her 62nd birthday this past Saturday.  She had a great time.  Her family took her out.  She really enjoyed herself.  She now is ready to start treatment.  She will go ahead and do Faslodex with the Ibrance.  She has seen surgery.  She is having a bit more back pain and right-sided rib pain.  This certainly could be from her tumors.  She also agrees to start the [place].  We can do the Xgeva every 3 months.  She has had no cough or shortness of breath.  She has had no headache.  She has had no nausea or vomiting.  She has had no change in bowel or bladder habits.  She is had no rashes.  There is been no leg swelling.  Overall, her performance status is ECOG 1.  Medications:  Current Outpatient Medications:  .  Biotin 2.5 MG CAPS, Take by mouth every morning., Disp: , Rfl:  .  cholecalciferol (VITAMIN D) 1000 UNITS tablet, Take 2,000 Units by mouth daily. , Disp: , Rfl:  .  Cyanocobalamin (VITAMIN B-12) 1000 MCG SUBL, Place under the tongue daily., Disp: , Rfl:  .  fulvestrant (FASLODEX) 250 MG/5ML injection, Inject 10 mLs (500 mg total) into the muscle every 30 (thirty) days. One injection each buttock over 1-2 minutes. Warm prior to use., Disp: 10 mL, Rfl: 11 .  Multiple Vitamin (MULTI-VITAMIN DAILY PO), Take by mouth every morning., Disp: , Rfl:  .  palbociclib (IBRANCE) 100 MG capsule, Take 1 capsule (100 mg total) by mouth daily with breakfast. Take for 21 days on, then 7 days off. (Patient not taking: Reported on 02/11/2018), Disp: 21 capsule, Rfl: 4  Allergies: No Known Allergies  Past Medical History,  Surgical history, Social history, and Family History were reviewed and updated.  Review of Systems: Review of Systems  Constitutional: Negative.   HENT:  Negative.   Eyes: Negative.   Respiratory: Negative.   Cardiovascular: Negative.   Gastrointestinal: Negative.   Endocrine: Negative.   Genitourinary: Negative.    Musculoskeletal: Negative.   Skin: Negative.   Neurological: Negative.   Hematological: Negative.   Psychiatric/Behavioral: Negative.     Physical Exam:  weight is 176 lb (79.8 kg). Her oral temperature is 97.6 F (36.4 C). Her blood pressure is 135/68 and her pulse is 80. Her respiration is 18 and oxygen saturation is 100%.   Wt Readings from Last 3 Encounters:  04/05/18 176 lb (79.8 kg)  02/11/18 177 lb (80.3 kg)  02/09/18 176 lb (79.8 kg)    Physical Exam  Constitutional: She is oriented to person, place, and time.  HENT:  Head: Normocephalic and atraumatic.  Mouth/Throat: Oropharynx is clear and moist.  Eyes: Pupils are equal, round, and reactive to light. EOM are normal.  Neck: Normal range of motion.  Cardiovascular: Normal rate, regular rhythm and normal heart sounds.  Pulmonary/Chest: Effort normal and breath sounds normal.  Abdominal: Soft. Bowel sounds are normal.  Musculoskeletal: Normal range of motion. She exhibits no edema, tenderness or deformity.  Lymphadenopathy:    She has no  cervical adenopathy.  Neurological: She is alert and oriented to person, place, and time.  Skin: Skin is warm and dry. No rash noted. No erythema.  Psychiatric: She has a normal mood and affect. Her behavior is normal. Judgment and thought content normal.  Vitals reviewed.    Lab Results  Component Value Date   WBC 4.6 04/05/2018   HGB 13.0 08/15/2015   HCT 38.4 04/05/2018   MCV 84.4 04/05/2018   PLT 160 04/05/2018     Chemistry      Component Value Date/Time   NA 145 02/09/2018 1032   NA 142 11/08/2013 1008   K 4.5 02/09/2018 1032   K 3.9 11/08/2013  1008   CL 106 02/09/2018 1032   CL 103 11/08/2013 1008   CO2 29 02/09/2018 1032   CO2 29 11/08/2013 1008   BUN 15 02/09/2018 1032   BUN 11 11/08/2013 1008   CREATININE 0.90 02/09/2018 1032   CREATININE 1.4 (H) 11/08/2013 1008      Component Value Date/Time   CALCIUM 9.8 02/09/2018 1032   CALCIUM 9.6 11/08/2013 1008   ALKPHOS 175 (H) 02/09/2018 1032   ALKPHOS 124 (H) 11/08/2013 1008   AST 23 02/09/2018 1032   ALT 25 02/09/2018 1032   ALT 25 11/08/2013 1008   BILITOT 0.9 02/09/2018 1032       Impression and Plan: Nancy Herring is a 62 year old postmenopausal Afro-American female.  She has known metastatic disease.   She now agrees to start treatment.  She does not want any surgery.  I can understand this.  We will go ahead and do Faslodex and Xgeva today.  She will start the Ibrance.  She knows to do the Ibrance 3 weeks on and one-week off.  We will plan to get her back in 1 more month.  I probably would give her 3 months of treatment before I would repeat her scans.  I spent about 30 minutes with her.  Over half the time was spent face-to-face reviewing her labs and x-rays and answering her questions.    Volanda Napoleon, MD 4/15/20198:50 AM

## 2018-04-06 LAB — CANCER ANTIGEN 27.29: CA 27.29: 14.6 U/mL (ref 0.0–38.6)

## 2018-04-07 ENCOUNTER — Encounter: Payer: Self-pay | Admitting: Hematology & Oncology

## 2018-04-28 ENCOUNTER — Encounter: Payer: Self-pay | Admitting: Internal Medicine

## 2018-05-03 ENCOUNTER — Encounter: Payer: Self-pay | Admitting: Hematology & Oncology

## 2018-05-03 ENCOUNTER — Other Ambulatory Visit: Payer: Self-pay

## 2018-05-03 ENCOUNTER — Inpatient Hospital Stay: Payer: 59

## 2018-05-03 ENCOUNTER — Inpatient Hospital Stay: Payer: 59 | Attending: Hematology & Oncology | Admitting: Hematology & Oncology

## 2018-05-03 VITALS — BP 131/63 | HR 71 | Temp 99.1°F | Resp 18 | Wt 176.0 lb

## 2018-05-03 DIAGNOSIS — C50012 Malignant neoplasm of nipple and areola, left female breast: Principal | ICD-10-CM

## 2018-05-03 DIAGNOSIS — Z780 Asymptomatic menopausal state: Secondary | ICD-10-CM

## 2018-05-03 DIAGNOSIS — Z170 Estrogen receptor positive status [ER+]: Principal | ICD-10-CM

## 2018-05-03 DIAGNOSIS — C50919 Malignant neoplasm of unspecified site of unspecified female breast: Secondary | ICD-10-CM

## 2018-05-03 DIAGNOSIS — C50011 Malignant neoplasm of nipple and areola, right female breast: Secondary | ICD-10-CM

## 2018-05-03 DIAGNOSIS — Z5111 Encounter for antineoplastic chemotherapy: Secondary | ICD-10-CM | POA: Insufficient documentation

## 2018-05-03 DIAGNOSIS — C50512 Malignant neoplasm of lower-outer quadrant of left female breast: Secondary | ICD-10-CM

## 2018-05-03 DIAGNOSIS — N951 Menopausal and female climacteric states: Secondary | ICD-10-CM

## 2018-05-03 LAB — CBC WITH DIFFERENTIAL (CANCER CENTER ONLY)
BASOS PCT: 0 %
Basophils Absolute: 0 10*3/uL (ref 0.0–0.1)
EOS ABS: 0.2 10*3/uL (ref 0.0–0.5)
EOS PCT: 4 %
HEMATOCRIT: 39.1 % (ref 34.8–46.6)
HEMOGLOBIN: 12.7 g/dL (ref 11.6–15.9)
Lymphocytes Relative: 42 %
Lymphs Abs: 1.7 10*3/uL (ref 0.9–3.3)
MCH: 28 pg (ref 26.0–34.0)
MCHC: 32.5 g/dL (ref 32.0–36.0)
MCV: 86.1 fL (ref 81.0–101.0)
MONO ABS: 0.3 10*3/uL (ref 0.1–0.9)
MONOS PCT: 7 %
Neutro Abs: 2 10*3/uL (ref 1.5–6.5)
Neutrophils Relative %: 47 %
Platelet Count: 162 10*3/uL (ref 145–400)
RBC: 4.54 MIL/uL (ref 3.70–5.32)
RDW: 12.6 % (ref 11.1–15.7)
WBC Count: 4.1 10*3/uL (ref 3.9–10.0)

## 2018-05-03 LAB — CMP (CANCER CENTER ONLY)
ALBUMIN: 3.6 g/dL (ref 3.5–5.0)
ALK PHOS: 128 U/L — AB (ref 26–84)
ALT: 23 U/L (ref 10–47)
AST: 23 U/L (ref 11–38)
Anion gap: 7 (ref 5–15)
BUN: 9 mg/dL (ref 7–22)
CHLORIDE: 108 mmol/L (ref 98–108)
CO2: 27 mmol/L (ref 18–33)
CREATININE: 0.8 mg/dL (ref 0.60–1.20)
Calcium: 9.5 mg/dL (ref 8.0–10.3)
Glucose, Bld: 129 mg/dL — ABNORMAL HIGH (ref 73–118)
Potassium: 3.9 mmol/L (ref 3.3–4.7)
SODIUM: 142 mmol/L (ref 128–145)
Total Bilirubin: 0.7 mg/dL (ref 0.2–1.6)
Total Protein: 7.4 g/dL (ref 6.4–8.1)

## 2018-05-03 MED ORDER — FULVESTRANT 250 MG/5ML IM SOLN
INTRAMUSCULAR | Status: AC
  Filled 2018-05-03: qty 10

## 2018-05-03 MED ORDER — FULVESTRANT 250 MG/5ML IM SOLN
500.0000 mg | Freq: Once | INTRAMUSCULAR | Status: AC
  Administered 2018-05-03: 500 mg via INTRAMUSCULAR

## 2018-05-03 NOTE — Progress Notes (Signed)
Hematology and Oncology Follow Up Visit  Nancy Herring [id number] [birth year] 62 y.o. 05/03/2018   Principle Diagnosis:   Metastatic breast cancer-ER positive/PR positive/HER-2 negative  Current Therapy:    Faslodex/Ibrance - cycle #1 to start on 04/05/2018 -- will start Ibrance on 05/03/2018  Xgeva - 120 mg SQ q 3 months -- next dose 06/2018     Interim History:  Nancy Herring is back for follow-up.  I guess that I should not be surprised that she has not yet started the [place].  I still not sure why she has not started the [place].  I told her that the [place] is a very helpful medication along with the Faslodex.  We are trying to do our best to minimize any surgical intervention for this left breast cancer.  Again, she tends to do what she feels is best for her self.  She promises that she will start the [place] now.  She is had no problems with the Faslodex.  She does have some hot flashes's.  There is occasional sweats.  Her last CA 27.29 in April was 14.6.    Overall, her performance status is ECOG 1.  Medications:  Current Outpatient Medications:  .  Biotin 2.5 MG CAPS, Take by mouth every morning., Disp: , Rfl:  .  cholecalciferol (VITAMIN D) 1000 UNITS tablet, Take 2,000 Units by mouth daily. , Disp: , Rfl:  .  Cyanocobalamin (VITAMIN B-12) 1000 MCG SUBL, Place under the tongue daily., Disp: , Rfl:  .  fulvestrant (FASLODEX) 250 MG/5ML injection, Inject 10 mLs (500 mg total) into the muscle every 30 (thirty) days. One injection each buttock over 1-2 minutes. Warm prior to use., Disp: 10 mL, Rfl: 11 .  Multiple Vitamin (MULTI-VITAMIN DAILY PO), Take by mouth every morning., Disp: , Rfl:  .  palbociclib (IBRANCE) 100 MG capsule, Take 1 capsule (100 mg total) by mouth daily with breakfast. Take for 21 days on, then 7 days off. (Patient not taking: Reported on 02/11/2018), Disp: 21 capsule, Rfl: 4  Allergies: No Known Allergies  Past Medical History, Surgical history,  Social history, and Family History were reviewed and updated.  Review of Systems: Review of Systems  Constitutional: Negative.   HENT:  Negative.   Eyes: Negative.   Respiratory: Negative.   Cardiovascular: Negative.   Gastrointestinal: Negative.   Endocrine: Negative.   Genitourinary: Negative.    Musculoskeletal: Negative.   Skin: Negative.   Neurological: Negative.   Hematological: Negative.   Psychiatric/Behavioral: Negative.     Physical Exam:  weight is 176 lb (79.8 kg). Her oral temperature is 99.1 F (37.3 C). Her blood pressure is 131/63 and her pulse is 71. Her respiration is 18 and oxygen saturation is 99%.   Wt Readings from Last 3 Encounters:  05/03/18 176 lb (79.8 kg)  04/05/18 176 lb (79.8 kg)  02/11/18 177 lb (80.3 kg)    Physical Exam  Constitutional: She is oriented to person, place, and time.  HENT:  Head: Normocephalic and atraumatic.  Mouth/Throat: Oropharynx is clear and moist.  Eyes: Pupils are equal, round, and reactive to light. EOM are normal.  Neck: Normal range of motion.  Cardiovascular: Normal rate, regular rhythm and normal heart sounds.  Pulmonary/Chest: Effort normal and breath sounds normal.  Abdominal: Soft. Bowel sounds are normal.  Musculoskeletal: Normal range of motion. She exhibits no edema, tenderness or deformity.  Lymphadenopathy:    She has no cervical adenopathy.  Neurological: She is alert and oriented to person,  place, and time.  Skin: Skin is warm and dry. No rash noted. No erythema.  Psychiatric: She has a normal mood and affect. Her behavior is normal. Judgment and thought content normal.  Vitals reviewed.    Lab Results  Component Value Date   WBC 4.1 05/03/2018   HGB 12.7 05/03/2018   HCT 39.1 05/03/2018   MCV 86.1 05/03/2018   PLT 162 05/03/2018     Chemistry      Component Value Date/Time   NA 142 05/03/2018 1012   NA 142 11/08/2013 1008   K 3.9 05/03/2018 1012   K 3.9 11/08/2013 1008   CL 108  05/03/2018 1012   CL 103 11/08/2013 1008   CO2 27 05/03/2018 1012   CO2 29 11/08/2013 1008   BUN 9 05/03/2018 1012   BUN 11 11/08/2013 1008   CREATININE 0.80 05/03/2018 1012   CREATININE 1.4 (H) 11/08/2013 1008      Component Value Date/Time   CALCIUM 9.5 05/03/2018 1012   CALCIUM 9.6 11/08/2013 1008   ALKPHOS 128 (H) 05/03/2018 1012   ALKPHOS 124 (H) 11/08/2013 1008   AST 23 05/03/2018 1012   ALT 23 05/03/2018 1012   ALT 25 11/08/2013 1008   BILITOT 0.7 05/03/2018 1012       Impression and Plan: Nancy Herring is a 62 year old postmenopausal Afro-American female.  She has known metastatic disease.   I still not sure if she will actually take the Ibrance.  Hopefully she will for her own medical benefit.  We will go ahead and plan to get her back in 1 more month.  As always, I had a good prayer session with her.  Volanda Napoleon, MD 5/13/201911:41 AM

## 2018-05-03 NOTE — Patient Instructions (Signed)

## 2018-05-04 LAB — CANCER ANTIGEN 27.29: CA 27.29: 16.2 U/mL (ref 0.0–38.6)

## 2018-05-31 ENCOUNTER — Other Ambulatory Visit: Payer: Self-pay

## 2018-05-31 ENCOUNTER — Encounter: Payer: Self-pay | Admitting: Hematology & Oncology

## 2018-05-31 ENCOUNTER — Inpatient Hospital Stay: Payer: 59 | Attending: Hematology & Oncology | Admitting: Hematology & Oncology

## 2018-05-31 ENCOUNTER — Inpatient Hospital Stay: Payer: 59

## 2018-05-31 VITALS — BP 123/71 | HR 78 | Temp 98.2°F | Resp 18 | Wt 170.0 lb

## 2018-05-31 DIAGNOSIS — C50919 Malignant neoplasm of unspecified site of unspecified female breast: Secondary | ICD-10-CM

## 2018-05-31 DIAGNOSIS — C50512 Malignant neoplasm of lower-outer quadrant of left female breast: Secondary | ICD-10-CM | POA: Insufficient documentation

## 2018-05-31 DIAGNOSIS — N951 Menopausal and female climacteric states: Secondary | ICD-10-CM | POA: Diagnosis not present

## 2018-05-31 DIAGNOSIS — Z5111 Encounter for antineoplastic chemotherapy: Secondary | ICD-10-CM | POA: Insufficient documentation

## 2018-05-31 DIAGNOSIS — Z170 Estrogen receptor positive status [ER+]: Secondary | ICD-10-CM

## 2018-05-31 DIAGNOSIS — C50012 Malignant neoplasm of nipple and areola, left female breast: Secondary | ICD-10-CM

## 2018-05-31 DIAGNOSIS — C50011 Malignant neoplasm of nipple and areola, right female breast: Secondary | ICD-10-CM

## 2018-05-31 DIAGNOSIS — R61 Generalized hyperhidrosis: Secondary | ICD-10-CM

## 2018-05-31 DIAGNOSIS — Z780 Asymptomatic menopausal state: Secondary | ICD-10-CM

## 2018-05-31 LAB — CBC WITH DIFFERENTIAL/PLATELET
BASOS PCT: 1 %
Basophils Absolute: 0 10*3/uL (ref 0.0–0.1)
EOS ABS: 0 10*3/uL (ref 0.0–0.5)
Eosinophils Relative: 2 %
HEMATOCRIT: 35.5 % (ref 34.8–46.6)
Hemoglobin: 11.6 g/dL (ref 11.6–15.9)
LYMPHS ABS: 1 10*3/uL (ref 0.9–3.3)
Lymphocytes Relative: 56 %
MCH: 28.1 pg (ref 25.1–34.0)
MCHC: 32.7 g/dL (ref 31.5–36.0)
MCV: 86 fL (ref 79.5–101.0)
Monocytes Absolute: 0.1 10*3/uL (ref 0.1–0.9)
Monocytes Relative: 5 %
NEUTROS ABS: 0.6 10*3/uL — AB (ref 1.5–6.5)
Neutrophils Relative %: 36 %
PLATELETS: 100 10*3/uL — AB (ref 145–400)
RBC: 4.13 MIL/uL (ref 3.70–5.45)
RDW: 13.5 % (ref 11.2–14.5)
WBC: 1.8 10*3/uL — AB (ref 3.9–10.3)

## 2018-05-31 LAB — CBC WITH DIFFERENTIAL (CANCER CENTER ONLY)

## 2018-05-31 LAB — CMP (CANCER CENTER ONLY)
ALBUMIN: 3 g/dL — AB (ref 3.5–5.0)
ALK PHOS: 86 U/L — AB (ref 26–84)
ALT: 20 U/L (ref 10–47)
ANION GAP: 10 (ref 5–15)
AST: 21 U/L (ref 11–38)
BILIRUBIN TOTAL: 0.8 mg/dL (ref 0.2–1.6)
BUN: 15 mg/dL (ref 7–22)
CHLORIDE: 106 mmol/L (ref 98–108)
CO2: 28 mmol/L (ref 18–33)
CREATININE: 0.8 mg/dL (ref 0.60–1.20)
Calcium: 8.9 mg/dL (ref 8.0–10.3)
Glucose, Bld: 171 mg/dL — ABNORMAL HIGH (ref 73–118)
POTASSIUM: 3.9 mmol/L (ref 3.3–4.7)
Sodium: 144 mmol/L (ref 128–145)
Total Protein: 6.3 g/dL — ABNORMAL LOW (ref 6.4–8.1)

## 2018-05-31 MED ORDER — FULVESTRANT 250 MG/5ML IM SOLN
INTRAMUSCULAR | Status: AC
  Filled 2018-05-31: qty 10

## 2018-05-31 MED ORDER — FULVESTRANT 250 MG/5ML IM SOLN
500.0000 mg | Freq: Once | INTRAMUSCULAR | Status: AC
  Administered 2018-05-31: 500 mg via INTRAMUSCULAR

## 2018-05-31 MED ORDER — DENOSUMAB 120 MG/1.7ML ~~LOC~~ SOLN
SUBCUTANEOUS | Status: AC
  Filled 2018-05-31: qty 1.7

## 2018-05-31 NOTE — Addendum Note (Signed)
Addended by: Burney Gauze R on: 05/31/2018 01:16 PM   Modules accepted: Orders

## 2018-05-31 NOTE — Progress Notes (Signed)
Hematology and Oncology Follow Up Visit  Nancy Herring [id number] April 18, 1956 62 y.o. 05/31/2018   Principle Diagnosis:   Metastatic breast cancer-ER positive/PR positive/HER-2 negative  Current Therapy:    Faslodex/Ibrance - cycle #1 to start on 04/05/2018 -- will start Ibrance on 05/03/2018  Xgeva - 120 mg SQ q 3 months -- next dose 06/2018     Interim History:  Nancy Herring is back for follow-up.  She is doing okay.  She is going down to [place] next week for a few days.  She and her husband go down there as there is family down there.  The Nancy Herring is doing all right with her.  She will have her 7-day break later on this week.  She does have some hot flashes and sweats.  I asked her if she like anything for this.  She said that she did not.  She has had some nausea but no vomiting.  Her last CA 27.29 was 16.  Overall, her performance status is ECOG 1.  Medications:  Current Outpatient Medications:  .  Biotin 2.5 MG CAPS, Take by mouth every morning., Disp: , Rfl:  .  cholecalciferol (VITAMIN D) 1000 UNITS tablet, Take 2,000 Units by mouth daily. , Disp: , Rfl:  .  Cyanocobalamin (VITAMIN B-12) 1000 MCG SUBL, Place under the tongue daily., Disp: , Rfl:  .  fulvestrant (FASLODEX) 250 MG/5ML injection, Inject 10 mLs (500 mg total) into the muscle every 30 (thirty) days. One injection each buttock over 1-2 minutes. Warm prior to use., Disp: 10 mL, Rfl: 11 .  Multiple Vitamin (MULTI-VITAMIN DAILY PO), Take by mouth every morning., Disp: , Rfl:  .  palbociclib (IBRANCE) 100 MG capsule, Take 1 capsule (100 mg total) by mouth daily with breakfast. Take for 21 days on, then 7 days off. (Patient not taking: Reported on 02/11/2018), Disp: 21 capsule, Rfl: 4  Allergies: No Known Allergies  Past Medical History, Surgical history, Social history, and Family History were reviewed and updated.  Review of Systems: Review of Systems  Constitutional: Negative.   HENT:  Negative.    Eyes: Negative.   Respiratory: Negative.   Cardiovascular: Negative.   Gastrointestinal: Negative.   Endocrine: Negative.   Genitourinary: Negative.    Musculoskeletal: Negative.   Skin: Negative.   Neurological: Negative.   Hematological: Negative.   Psychiatric/Behavioral: Negative.     Physical Exam:  weight is 170 lb (77.1 kg). Her oral temperature is 98.2 F (36.8 C). Her blood pressure is 123/71 and her pulse is 78. Her respiration is 18 and oxygen saturation is 99%.   Wt Readings from Last 3 Encounters:  05/31/18 170 lb (77.1 kg)  05/03/18 176 lb (79.8 kg)  04/05/18 176 lb (79.8 kg)    Physical Exam  Constitutional: She is oriented to person, place, and time.  HENT:  Head: Normocephalic and atraumatic.  Mouth/Throat: Oropharynx is clear and moist.  Eyes: Pupils are equal, round, and reactive to light. EOM are normal.  Neck: Normal range of motion.  Cardiovascular: Normal rate, regular rhythm and normal heart sounds.  Pulmonary/Chest: Effort normal and breath sounds normal.  Abdominal: Soft. Bowel sounds are normal.  Musculoskeletal: Normal range of motion. She exhibits no edema, tenderness or deformity.  Lymphadenopathy:    She has no cervical adenopathy.  Neurological: She is alert and oriented to person, place, and time.  Skin: Skin is warm and dry. No rash noted. No erythema.  Psychiatric: She has a normal mood and affect.  Her behavior is normal. Judgment and thought content normal.  Vitals reviewed.    Lab Results  Component Value Date   WBC 4.1 05/03/2018   HGB 12.7 05/03/2018   HCT 39.1 05/03/2018   MCV 86.1 05/03/2018   PLT 162 05/03/2018     Chemistry      Component Value Date/Time   NA 142 05/03/2018 1012   NA 142 11/08/2013 1008   K 3.9 05/03/2018 1012   K 3.9 11/08/2013 1008   CL 108 05/03/2018 1012   CL 103 11/08/2013 1008   CO2 27 05/03/2018 1012   CO2 29 11/08/2013 1008   BUN 9 05/03/2018 1012   BUN 11 11/08/2013 1008   CREATININE  0.80 05/03/2018 1012   CREATININE 1.4 (H) 11/08/2013 1008      Component Value Date/Time   CALCIUM 9.5 05/03/2018 1012   CALCIUM 9.6 11/08/2013 1008   ALKPHOS 128 (H) 05/03/2018 1012   ALKPHOS 124 (H) 11/08/2013 1008   AST 23 05/03/2018 1012   ALT 23 05/03/2018 1012   ALT 25 11/08/2013 1008   BILITOT 0.7 05/03/2018 1012       Impression and Plan: Nancy Herring is a 62 year old postmenopausal Afro-American female.  She has known metastatic disease.   We will go ahead with her Faslodex today.  This will be her third month of Faslodex.  I probably will set her up with scans sometime in July.  I will plan to see her back in July.  I am just happy that she is taking the medicine now.  Hopefully we will see a response.  Volanda Napoleon, MD 6/10/201910:24 AM

## 2018-05-31 NOTE — Patient Instructions (Addendum)

## 2018-06-01 LAB — CANCER ANTIGEN 27.29: CA 27.29: 17.9 U/mL (ref 0.0–38.6)

## 2018-06-28 ENCOUNTER — Encounter: Payer: Self-pay | Admitting: Hematology & Oncology

## 2018-06-28 ENCOUNTER — Inpatient Hospital Stay: Payer: 59

## 2018-06-28 ENCOUNTER — Other Ambulatory Visit: Payer: Self-pay

## 2018-06-28 ENCOUNTER — Inpatient Hospital Stay: Payer: 59 | Attending: Hematology & Oncology | Admitting: Hematology & Oncology

## 2018-06-28 VITALS — BP 119/71 | HR 72 | Temp 98.3°F | Resp 18 | Wt 179.0 lb

## 2018-06-28 DIAGNOSIS — Z5111 Encounter for antineoplastic chemotherapy: Secondary | ICD-10-CM | POA: Insufficient documentation

## 2018-06-28 DIAGNOSIS — Z780 Asymptomatic menopausal state: Secondary | ICD-10-CM

## 2018-06-28 DIAGNOSIS — C50512 Malignant neoplasm of lower-outer quadrant of left female breast: Secondary | ICD-10-CM | POA: Diagnosis present

## 2018-06-28 DIAGNOSIS — R5383 Other fatigue: Secondary | ICD-10-CM

## 2018-06-28 DIAGNOSIS — Z170 Estrogen receptor positive status [ER+]: Principal | ICD-10-CM

## 2018-06-28 DIAGNOSIS — C50919 Malignant neoplasm of unspecified site of unspecified female breast: Secondary | ICD-10-CM

## 2018-06-28 DIAGNOSIS — N6320 Unspecified lump in the left breast, unspecified quadrant: Secondary | ICD-10-CM

## 2018-06-28 DIAGNOSIS — C50011 Malignant neoplasm of nipple and areola, right female breast: Secondary | ICD-10-CM

## 2018-06-28 DIAGNOSIS — C50012 Malignant neoplasm of nipple and areola, left female breast: Secondary | ICD-10-CM

## 2018-06-28 LAB — CMP (CANCER CENTER ONLY)
ALK PHOS: 95 U/L — AB (ref 26–84)
ALT: 23 U/L (ref 10–47)
ANION GAP: 6 (ref 5–15)
AST: 19 U/L (ref 11–38)
Albumin: 3.6 g/dL (ref 3.5–5.0)
BILIRUBIN TOTAL: 0.7 mg/dL (ref 0.2–1.6)
BUN: 13 mg/dL (ref 7–22)
CO2: 30 mmol/L (ref 18–33)
Calcium: 9.3 mg/dL (ref 8.0–10.3)
Chloride: 106 mmol/L (ref 98–108)
Creatinine: 1 mg/dL (ref 0.60–1.20)
Glucose, Bld: 148 mg/dL — ABNORMAL HIGH (ref 73–118)
POTASSIUM: 4.1 mmol/L (ref 3.3–4.7)
Sodium: 142 mmol/L (ref 128–145)
Total Protein: 7.4 g/dL (ref 6.4–8.1)

## 2018-06-28 LAB — CBC WITH DIFFERENTIAL (CANCER CENTER ONLY)
BASOS PCT: 1 %
Basophils Absolute: 0 10*3/uL (ref 0.0–0.1)
EOS ABS: 0 10*3/uL (ref 0.0–0.5)
Eosinophils Relative: 2 %
HCT: 35.9 % (ref 34.8–46.6)
HEMOGLOBIN: 12 g/dL (ref 11.6–15.9)
Lymphocytes Relative: 48 %
Lymphs Abs: 1.1 10*3/uL (ref 0.9–3.3)
MCH: 28.9 pg (ref 26.0–34.0)
MCHC: 33.4 g/dL (ref 32.0–36.0)
MCV: 86.5 fL (ref 81.0–101.0)
MONOS PCT: 5 %
Monocytes Absolute: 0.1 10*3/uL (ref 0.1–0.9)
NEUTROS PCT: 44 %
Neutro Abs: 1 10*3/uL — ABNORMAL LOW (ref 1.5–6.5)
Platelet Count: 197 10*3/uL (ref 145–400)
RBC: 4.15 MIL/uL (ref 3.70–5.32)
RDW: 14.6 % (ref 11.1–15.7)
WBC Count: 2.2 10*3/uL — ABNORMAL LOW (ref 3.9–10.0)

## 2018-06-28 MED ORDER — FULVESTRANT 250 MG/5ML IM SOLN
500.0000 mg | Freq: Once | INTRAMUSCULAR | Status: AC
Start: 2018-06-28 — End: 2018-06-28
  Administered 2018-06-28: 500 mg via INTRAMUSCULAR

## 2018-06-28 MED ORDER — FULVESTRANT 250 MG/5ML IM SOLN
INTRAMUSCULAR | Status: AC
  Filled 2018-06-28: qty 5

## 2018-06-28 MED ORDER — DENOSUMAB 120 MG/1.7ML ~~LOC~~ SOLN
120.0000 mg | Freq: Once | SUBCUTANEOUS | Status: AC
  Administered 2018-06-28: 120 mg via SUBCUTANEOUS

## 2018-06-28 NOTE — Progress Notes (Signed)
Hematology and Oncology Follow Up Visit  Nancy Herring [id number] November 30, 1956 62 y.o. 06/28/2018   Principle Diagnosis:   Metastatic breast cancer-ER positive/PR positive/HER-2 negative  Current Therapy:    Faslodex/Ibrance - cycle #1 to start on 04/05/2018 -- will start Ibrance on 05/03/2018  Xgeva - 120 mg SQ q 3 months -- next dose 09/2018     Interim History:  Nancy Herring is back for follow-up.  Nancy Herring does feel tired.  Nancy Herring still tired for the past week.  I am not sure what could be causing this.  Nancy Herring is on [place].  This is her second week of taking the Ibrance of the cycle.  Her brother is not doing well down in [place].  He had heart surgery a month ago.  He has had complications with this.  Nancy Herring is going down next week for about a week or so.  Her last CA 27.29 was 18.  This is holding pretty steady.  Nancy Herring has not noted any swelling in her left breast.  Nancy Herring is had occasional back discomfort but nothing that is constant.  Nancy Herring has had no change in bowel or bladder habits.  There is been occasional leg swelling when Nancy Herring drives down to [place].  Nancy Herring is had no change in medications.  Overall, her performance status is ECOG 1.  Medications:  Current Outpatient Medications:  .  Biotin 2.5 MG CAPS, Take by mouth every morning., Disp: , Rfl:  .  cholecalciferol (VITAMIN D) 1000 UNITS tablet, Take 2,000 Units by mouth daily. , Disp: , Rfl:  .  Cyanocobalamin (VITAMIN B-12) 1000 MCG SUBL, Place under the tongue daily., Disp: , Rfl:  .  fulvestrant (FASLODEX) 250 MG/5ML injection, Inject 10 mLs (500 mg total) into the muscle every 30 (thirty) days. One injection each buttock over 1-2 minutes. Warm prior to use., Disp: 10 mL, Rfl: 11 .  Multiple Vitamin (MULTI-VITAMIN DAILY PO), Take by mouth every morning., Disp: , Rfl:  .  palbociclib (IBRANCE) 100 MG capsule, Take 1 capsule (100 mg total) by mouth daily with breakfast. Take for 21 days on, then 7 days off. (Patient not  taking: Reported on 02/11/2018), Disp: 21 capsule, Rfl: 4  Allergies: No Known Allergies  Past Medical History, Surgical history, Social history, and Family History were reviewed and updated.  Review of Systems: Review of Systems  Constitutional: Negative.   HENT:  Negative.   Eyes: Negative.   Respiratory: Negative.   Cardiovascular: Negative.   Gastrointestinal: Negative.   Endocrine: Negative.   Genitourinary: Negative.    Musculoskeletal: Negative.   Skin: Negative.   Neurological: Negative.   Hematological: Negative.   Psychiatric/Behavioral: Negative.     Physical Exam:  weight is 179 lb (81.2 kg). Her oral temperature is 98.3 F (36.8 C). Her blood pressure is 119/71 and her pulse is 72. Her respiration is 18 and oxygen saturation is 100%.   Wt Readings from Last 3 Encounters:  06/28/18 179 lb (81.2 kg)  05/31/18 170 lb (77.1 kg)  05/03/18 176 lb (79.8 kg)    Physical Exam  Constitutional: Nancy Herring is oriented to person, place, and time.  HENT:  Head: Normocephalic and atraumatic.  Mouth/Throat: Oropharynx is clear and moist.  Eyes: Pupils are equal, round, and reactive to light. EOM are normal.  Neck: Normal range of motion.  Cardiovascular: Normal rate, regular rhythm and normal heart sounds.  Pulmonary/Chest: Effort normal and breath sounds normal.  Abdominal: Soft. Bowel sounds are normal.  Musculoskeletal:  Normal range of motion. Nancy Herring exhibits no edema, tenderness or deformity.  Lymphadenopathy:    Nancy Herring has no cervical adenopathy.  Neurological: Nancy Herring is alert and oriented to person, place, and time.  Skin: Skin is warm and dry. No rash noted. No erythema.  Psychiatric: Nancy Herring has a normal mood and affect. Her behavior is normal. Judgment and thought content normal.  Vitals reviewed.    Lab Results  Component Value Date   WBC 2.2 (L) 06/28/2018   HGB 12.0 06/28/2018   HCT 35.9 06/28/2018   MCV 86.5 06/28/2018   PLT 197 06/28/2018     Chemistry       Component Value Date/Time   NA 144 05/31/2018 0948   NA 142 11/08/2013 1008   K 3.9 05/31/2018 0948   K 3.9 11/08/2013 1008   CL 106 05/31/2018 0948   CL 103 11/08/2013 1008   CO2 28 05/31/2018 0948   CO2 29 11/08/2013 1008   BUN 15 05/31/2018 0948   BUN 11 11/08/2013 1008   CREATININE 0.80 05/31/2018 0948   CREATININE 1.4 (H) 11/08/2013 1008      Component Value Date/Time   CALCIUM 8.9 05/31/2018 0948   CALCIUM 9.6 11/08/2013 1008   ALKPHOS 86 (H) 05/31/2018 0948   ALKPHOS 124 (H) 11/08/2013 1008   AST 21 05/31/2018 0948   ALT 20 05/31/2018 0948   ALT 25 11/08/2013 1008   BILITOT 0.8 05/31/2018 0948       Impression and Plan: Nancy Herring is a 62 year old postmenopausal Afro-American female.  Nancy Herring has known metastatic disease.   We will go ahead with her Faslodex today.  This will be her fourth month of Faslodex.  I will plan on getting some scans on her.  Nancy Herring will get Xgeva today.  I will see her back in 1 more month.  Volanda Napoleon, MD 7/8/201912:41 PM

## 2018-06-28 NOTE — Patient Instructions (Signed)
Fulvestrant injection What is this medicine? FULVESTRANT (ful VES trant) blocks the effects of estrogen. It is used to treat breast cancer. This medicine may be used for other purposes; ask your health care provider or pharmacist if you have questions. COMMON BRAND NAME(S): FASLODEX What should I tell my health care provider before I take this medicine? They need to know if you have any of these conditions: -bleeding problems -liver disease -low levels of platelets in the blood -an unusual or allergic reaction to fulvestrant, other medicines, foods, dyes, or preservatives -pregnant or trying to get pregnant -breast-feeding How should I use this medicine? This medicine is for injection into a muscle. It is usually given by a health care professional in a hospital or clinic setting. Talk to your pediatrician regarding the use of this medicine in children. Special care may be needed. Overdosage: If you think you have taken too much of this medicine contact a poison control center or emergency room at once. NOTE: This medicine is only for you. Do not share this medicine with others. What if I miss a dose? It is important not to miss your dose. Call your doctor or health care professional if you are unable to keep an appointment. What may interact with this medicine? -medicines that treat or prevent blood clots like warfarin, enoxaparin, and dalteparin This list may not describe all possible interactions. Give your health care provider a list of all the medicines, herbs, non-prescription drugs, or dietary supplements you use. Also tell them if you smoke, drink alcohol, or use illegal drugs. Some items may interact with your medicine. What should I watch for while using this medicine? Your condition will be monitored carefully while you are receiving this medicine. You will need important blood work done while you are taking this medicine. Do not become pregnant while taking this medicine or for  at least 1 year after stopping it. Women of child-bearing potential will need to have a negative pregnancy test before starting this medicine. Women should inform their doctor if they wish to become pregnant or think they might be pregnant. There is a potential for serious side effects to an unborn child. Men should inform their doctors if they wish to father a child. This medicine may lower sperm counts. Talk to your health care professional or pharmacist for more information. Do not breast-feed an infant while taking this medicine or for 1 year after the last dose. What side effects may I notice from receiving this medicine? Side effects that you should report to your doctor or health care professional as soon as possible: -allergic reactions like skin rash, itching or hives, swelling of the face, lips, or tongue -feeling faint or lightheaded, falls -pain, tingling, numbness, or weakness in the legs -signs and symptoms of infection like fever or chills; cough; flu-like symptoms; sore throat -vaginal bleeding Side effects that usually do not require medical attention (report to your doctor or health care professional if they continue or are bothersome): -aches, pains -constipation -diarrhea -headache -hot flashes -nausea, vomiting -pain at site where injected -stomach pain This list may not describe all possible side effects. Call your doctor for medical advice about side effects. You may report side effects to FDA at 1-800-FDA-1088. Where should I keep my medicine? This drug is given in a hospital or clinic and will not be stored at home. NOTE: This sheet is a summary. It may not cover all possible information. If you have questions about this medicine, talk to your   doctor, pharmacist, or health care provider.  2018 Elsevier/Gold Standard (2015-07-06 11:03:55) Denosumab injection What is this medicine? DENOSUMAB (den oh sue mab) slows bone breakdown. Prolia is used to treat osteoporosis in  women after menopause and in men. Delton See is used to treat a high calcium level due to cancer and to prevent bone fractures and other bone problems caused by multiple myeloma or cancer bone metastases. Delton See is also used to treat giant cell tumor of the bone. This medicine may be used for other purposes; ask your health care provider or pharmacist if you have questions. COMMON BRAND NAME(S): Prolia, XGEVA What should I tell my health care provider before I take this medicine? They need to know if you have any of these conditions: -dental disease -having surgery or tooth extraction -infection -kidney disease -low levels of calcium or Vitamin D in the blood -malnutrition -on hemodialysis -skin conditions or sensitivity -thyroid or parathyroid disease -an unusual reaction to denosumab, other medicines, foods, dyes, or preservatives -pregnant or trying to get pregnant -breast-feeding How should I use this medicine? This medicine is for injection under the skin. It is given by a health care professional in a hospital or clinic setting. If you are getting Prolia, a special MedGuide will be given to you by the pharmacist with each prescription and refill. Be sure to read this information carefully each time. For Prolia, talk to your pediatrician regarding the use of this medicine in children. Special care may be needed. For Delton See, talk to your pediatrician regarding the use of this medicine in children. While this drug may be prescribed for children as young as 13 years for selected conditions, precautions do apply. Overdosage: If you think you have taken too much of this medicine contact a poison control center or emergency room at once. NOTE: This medicine is only for you. Do not share this medicine with others. What if I miss a dose? It is important not to miss your dose. Call your doctor or health care professional if you are unable to keep an appointment. What may interact with this  medicine? Do not take this medicine with any of the following medications: -other medicines containing denosumab This medicine may also interact with the following medications: -medicines that lower your chance of fighting infection -steroid medicines like prednisone or cortisone This list may not describe all possible interactions. Give your health care provider a list of all the medicines, herbs, non-prescription drugs, or dietary supplements you use. Also tell them if you smoke, drink alcohol, or use illegal drugs. Some items may interact with your medicine. What should I watch for while using this medicine? Visit your doctor or health care professional for regular checks on your progress. Your doctor or health care professional may order blood tests and other tests to see how you are doing. Call your doctor or health care professional for advice if you get a fever, chills or sore throat, or other symptoms of a cold or flu. Do not treat yourself. This drug may decrease your body's ability to fight infection. Try to avoid being around people who are sick. You should make sure you get enough calcium and vitamin D while you are taking this medicine, unless your doctor tells you not to. Discuss the foods you eat and the vitamins you take with your health care professional. See your dentist regularly. Brush and floss your teeth as directed. Before you have any dental work done, tell your dentist you are receiving this medicine. Do  not become pregnant while taking this medicine or for 5 months after stopping it. Talk with your doctor or health care professional about your birth control options while taking this medicine. Women should inform their doctor if they wish to become pregnant or think they might be pregnant. There is a potential for serious side effects to an unborn child. Talk to your health care professional or pharmacist for more information. What side effects may I notice from receiving this  medicine? Side effects that you should report to your doctor or health care professional as soon as possible: -allergic reactions like skin rash, itching or hives, swelling of the face, lips, or tongue -bone pain -breathing problems -dizziness -jaw pain, especially after dental work -redness, blistering, peeling of the skin -signs and symptoms of infection like fever or chills; cough; sore throat; pain or trouble passing urine -signs of low calcium like fast heartbeat, muscle cramps or muscle pain; pain, tingling, numbness in the hands or feet; seizures -unusual bleeding or bruising -unusually weak or tired Side effects that usually do not require medical attention (report to your doctor or health care professional if they continue or are bothersome): -constipation -diarrhea -headache -joint pain -loss of appetite -muscle pain -runny nose -tiredness -upset stomach This list may not describe all possible side effects. Call your doctor for medical advice about side effects. You may report side effects to FDA at 1-800-FDA-1088. Where should I keep my medicine? This medicine is only given in a clinic, doctor's office, or other health care setting and will not be stored at home. NOTE: This sheet is a summary. It may not cover all possible information. If you have questions about this medicine, talk to your doctor, pharmacist, or health care provider.  2018 Elsevier/Gold Standard (2016-12-30 19:17:21)  

## 2018-06-29 LAB — CANCER ANTIGEN 27.29: CAN 27.29: 15.3 U/mL (ref 0.0–38.6)

## 2018-07-09 ENCOUNTER — Other Ambulatory Visit: Payer: Self-pay | Admitting: Hematology & Oncology

## 2018-07-09 DIAGNOSIS — N6320 Unspecified lump in the left breast, unspecified quadrant: Secondary | ICD-10-CM

## 2018-07-14 ENCOUNTER — Ambulatory Visit: Payer: 59

## 2018-07-14 ENCOUNTER — Ambulatory Visit
Admission: RE | Admit: 2018-07-14 | Discharge: 2018-07-14 | Disposition: A | Discharge status: Home / Self Care | Payer: 59 | Source: Ambulatory Visit | Attending: Hematology & Oncology | Admitting: Hematology & Oncology

## 2018-07-14 DIAGNOSIS — N6320 Unspecified lump in the left breast, unspecified quadrant: Secondary | ICD-10-CM

## 2018-07-16 ENCOUNTER — Other Ambulatory Visit: Payer: Self-pay | Admitting: Pharmacist

## 2018-07-22 ENCOUNTER — Ambulatory Visit (HOSPITAL_COMMUNITY): Payer: 59

## 2018-07-26 ENCOUNTER — Other Ambulatory Visit: Payer: 59

## 2018-07-26 ENCOUNTER — Ambulatory Visit: Payer: 59

## 2018-07-26 ENCOUNTER — Ambulatory Visit: Payer: 59 | Admitting: Hematology & Oncology

## 2018-08-02 ENCOUNTER — Inpatient Hospital Stay: Payer: 59

## 2018-08-02 ENCOUNTER — Inpatient Hospital Stay: Payer: 59 | Attending: Hematology & Oncology

## 2018-08-02 ENCOUNTER — Encounter: Payer: Self-pay | Admitting: Hematology & Oncology

## 2018-08-02 ENCOUNTER — Inpatient Hospital Stay (HOSPITAL_BASED_OUTPATIENT_CLINIC_OR_DEPARTMENT_OTHER): Payer: 59 | Admitting: Hematology & Oncology

## 2018-08-02 ENCOUNTER — Other Ambulatory Visit: Payer: Self-pay

## 2018-08-02 VITALS — BP 121/60 | HR 81 | Temp 98.2°F | Resp 18 | Wt 183.0 lb

## 2018-08-02 DIAGNOSIS — C50011 Malignant neoplasm of nipple and areola, right female breast: Secondary | ICD-10-CM

## 2018-08-02 DIAGNOSIS — Z780 Asymptomatic menopausal state: Secondary | ICD-10-CM

## 2018-08-02 DIAGNOSIS — Z5111 Encounter for antineoplastic chemotherapy: Secondary | ICD-10-CM | POA: Insufficient documentation

## 2018-08-02 DIAGNOSIS — C50919 Malignant neoplasm of unspecified site of unspecified female breast: Secondary | ICD-10-CM

## 2018-08-02 DIAGNOSIS — C50512 Malignant neoplasm of lower-outer quadrant of left female breast: Secondary | ICD-10-CM | POA: Diagnosis present

## 2018-08-02 DIAGNOSIS — N951 Menopausal and female climacteric states: Secondary | ICD-10-CM | POA: Diagnosis not present

## 2018-08-02 DIAGNOSIS — Z170 Estrogen receptor positive status [ER+]: Principal | ICD-10-CM

## 2018-08-02 LAB — CMP (CANCER CENTER ONLY)
ALT: 24 U/L (ref 10–47)
ANION GAP: 5 (ref 5–15)
AST: 20 U/L (ref 11–38)
Albumin: 3.6 g/dL (ref 3.5–5.0)
Alkaline Phosphatase: 91 U/L — ABNORMAL HIGH (ref 26–84)
BUN: 10 mg/dL (ref 7–22)
CALCIUM: 8.9 mg/dL (ref 8.0–10.3)
CO2: 28 mmol/L (ref 18–33)
Chloride: 109 mmol/L — ABNORMAL HIGH (ref 98–108)
Creatinine: 1 mg/dL (ref 0.60–1.20)
Glucose, Bld: 89 mg/dL (ref 73–118)
POTASSIUM: 3.7 mmol/L (ref 3.3–4.7)
SODIUM: 142 mmol/L (ref 128–145)
Total Bilirubin: 0.7 mg/dL (ref 0.2–1.6)
Total Protein: 7.4 g/dL (ref 6.4–8.1)

## 2018-08-02 LAB — CBC WITH DIFFERENTIAL (CANCER CENTER ONLY)
BASOS ABS: 0 10*3/uL (ref 0.0–0.1)
Basophils Relative: 0 %
EOS ABS: 0 10*3/uL (ref 0.0–0.5)
EOS PCT: 1 %
HCT: 31.5 % — ABNORMAL LOW (ref 34.8–46.6)
Hemoglobin: 10.4 g/dL — ABNORMAL LOW (ref 11.6–15.9)
LYMPHS PCT: 49 %
Lymphs Abs: 1.1 10*3/uL (ref 0.9–3.3)
MCH: 29.8 pg (ref 26.0–34.0)
MCHC: 33 g/dL (ref 32.0–36.0)
MCV: 90.3 fL (ref 81.0–101.0)
MONO ABS: 0.1 10*3/uL (ref 0.1–0.9)
Monocytes Relative: 5 %
Neutro Abs: 1 10*3/uL — ABNORMAL LOW (ref 1.5–6.5)
Neutrophils Relative %: 45 %
PLATELETS: 152 10*3/uL (ref 145–400)
RBC: 3.49 MIL/uL — AB (ref 3.70–5.32)
RDW: 14.7 % (ref 11.1–15.7)
WBC: 2.3 10*3/uL — AB (ref 3.9–10.0)

## 2018-08-02 MED ORDER — FULVESTRANT 250 MG/5ML IM SOLN
INTRAMUSCULAR | Status: AC
  Filled 2018-08-02: qty 10

## 2018-08-02 MED ORDER — MEGESTROL ACETATE 20 MG PO TABS: 20.0000 mg | ORAL_TABLET | Freq: Every day | ORAL | 6 refills | Status: AC

## 2018-08-02 MED ORDER — FULVESTRANT 250 MG/5ML IM SOLN
500.0000 mg | Freq: Once | INTRAMUSCULAR | Status: AC
  Administered 2018-08-02: 500 mg via INTRAMUSCULAR

## 2018-08-02 NOTE — Patient Instructions (Signed)

## 2018-08-02 NOTE — Progress Notes (Signed)
Hematology and Oncology Follow Up Visit  Nancy Herring [id number] May 18, 1956 62 y.o. 08/02/2018   Principle Diagnosis:   Metastatic breast cancer-ER positive/PR positive/HER-2 negative  Current Therapy:    Faslodex/Ibrance - cycle #1 to start on 04/05/2018 -- will start Ibrance on 05/03/2018  Xgeva - 120 mg SQ q 3 months -- next dose 09/2018     Interim History:  Nancy Herring is back for follow-up.  She just got back from [place].  She is down there try to help out her brother who had heart surgery.  She is down there for about 2 weeks.  Her main complaint this time is hot flashes and sweats.  I am sure this is from the Faslodex/Ibrance.  We will try her on some Megace (20 mg p.o. daily) to see if this can help.  Her last CA 27.29 in July was normal at 15.3.  She had a mammogram done.  This was done on 07/14/2018.  This showed a stable appearance of the 2 areas in the left breast.  There is no growth.  No new areas are appreciated.  I believe that the Faslodex/Ibrance is helping a little bit.  She has had no nausea or vomiting.  There is been no change in bowel or bladder habits.  She has had no bleeding.  There is been no leg swelling.  Overall, her performance status is ECOG 0.    Medications:  Current Outpatient Medications:  .  Biotin 2.5 MG CAPS, Take by mouth every morning., Disp: , Rfl:  .  cholecalciferol (VITAMIN D) 1000 UNITS tablet, Take 2,000 Units by mouth daily. , Disp: , Rfl:  .  Cyanocobalamin (VITAMIN B-12) 1000 MCG SUBL, Place under the tongue daily., Disp: , Rfl:  .  fulvestrant (FASLODEX) 250 MG/5ML injection, Inject 10 mLs (500 mg total) into the muscle every 30 (thirty) days. One injection each buttock over 1-2 minutes. Warm prior to use., Disp: 10 mL, Rfl: 11 .  Multiple Vitamin (MULTI-VITAMIN DAILY PO), Take by mouth every morning., Disp: , Rfl:  .  palbociclib (IBRANCE) 100 MG capsule, Take 1 capsule (100 mg total) by mouth daily with  breakfast. Take for 21 days on, then 7 days off. (Patient not taking: Reported on 02/11/2018), Disp: 21 capsule, Rfl: 4 No current facility-administered medications for this visit.   Facility-Administered Medications Ordered in Other Visits:  .  fulvestrant (FASLODEX) injection 500 mg, 500 mg, Intramuscular, Once, Ennever, Rudell Cobb, MD  Allergies: No Known Allergies  Past Medical History, Surgical history, Social history, and Family History were reviewed and updated.  Review of Systems: Review of Systems  Constitutional: Negative.   HENT:  Negative.   Eyes: Negative.   Respiratory: Negative.   Cardiovascular: Negative.   Gastrointestinal: Negative.   Endocrine: Negative.   Genitourinary: Negative.    Musculoskeletal: Negative.   Skin: Negative.   Neurological: Negative.   Hematological: Negative.   Psychiatric/Behavioral: Negative.     Physical Exam:  weight is 183 lb (83 kg). Her oral temperature is 98.2 F (36.8 C). Her blood pressure is 121/60 and her pulse is 81. Her respiration is 18 and oxygen saturation is 100%.   Wt Readings from Last 3 Encounters:  08/02/18 183 lb (83 kg)  06/28/18 179 lb (81.2 kg)  05/31/18 170 lb (77.1 kg)    Physical Exam  Constitutional: She is oriented to person, place, and time.  HENT:  Head: Normocephalic and atraumatic.  Mouth/Throat: Oropharynx is clear and moist.  Eyes: Pupils are equal, round, and reactive to light. EOM are normal.  Neck: Normal range of motion.  Cardiovascular: Normal rate, regular rhythm and normal heart sounds.  Pulmonary/Chest: Effort normal and breath sounds normal.  Abdominal: Soft. Bowel sounds are normal.  Musculoskeletal: Normal range of motion. She exhibits no edema, tenderness or deformity.  Lymphadenopathy:    She has no cervical adenopathy.  Neurological: She is alert and oriented to person, place, and time.  Skin: Skin is warm and dry. No rash noted. No erythema.  Psychiatric: She has a normal mood  and affect. Her behavior is normal. Judgment and thought content normal.  Vitals reviewed.    Lab Results  Component Value Date   WBC 2.3 (L) 08/02/2018   HGB 10.4 (L) 08/02/2018   HCT 31.5 (L) 08/02/2018   MCV 90.3 08/02/2018   PLT 152 08/02/2018     Chemistry      Component Value Date/Time   NA 142 08/02/2018 1439   NA 142 11/08/2013 1008   K 3.7 08/02/2018 1439   K 3.9 11/08/2013 1008   CL 109 (H) 08/02/2018 1439   CL 103 11/08/2013 1008   CO2 28 08/02/2018 1439   CO2 29 11/08/2013 1008   BUN 10 08/02/2018 1439   BUN 11 11/08/2013 1008   CREATININE 1.00 08/02/2018 1439   CREATININE 1.4 (H) 11/08/2013 1008      Component Value Date/Time   CALCIUM 8.9 08/02/2018 1439   CALCIUM 9.6 11/08/2013 1008   ALKPHOS 91 (H) 08/02/2018 1439   ALKPHOS 124 (H) 11/08/2013 1008   AST 20 08/02/2018 1439   ALT 24 08/02/2018 1439   ALT 25 11/08/2013 1008   BILITOT 0.7 08/02/2018 1439       Impression and Plan: Nancy Herring is a 62 year old postmenopausal Afro-American female.  She has known metastatic disease.   I will have to reorder the PET scan.  She did not have the one I ordered back in July as she was down helping her brother out.  We will see her back in a month.  We will get the PET scan in about 3 weeks.  I am just happy that she seems to be holding her own right now.  She just has not agreed to any surgery for the breast lesions.  Nancy Napoleon, MD 8/12/20193:18 PM

## 2018-08-03 LAB — CANCER ANTIGEN 27.29: CA 27.29: 14.9 U/mL (ref 0.0–38.6)

## 2018-08-24 ENCOUNTER — Encounter (HOSPITAL_COMMUNITY): Admission: RE | Admit: 2018-08-24 | Payer: 59 | Source: Ambulatory Visit

## 2018-08-30 ENCOUNTER — Encounter: Payer: Self-pay | Admitting: Hematology & Oncology

## 2018-08-30 ENCOUNTER — Inpatient Hospital Stay: Payer: 59

## 2018-08-30 ENCOUNTER — Other Ambulatory Visit: Payer: Self-pay

## 2018-08-30 ENCOUNTER — Inpatient Hospital Stay: Payer: 59 | Attending: Hematology & Oncology | Admitting: Hematology & Oncology

## 2018-08-30 VITALS — BP 135/73 | HR 71 | Temp 98.2°F | Resp 16 | Wt 182.0 lb

## 2018-08-30 DIAGNOSIS — Z780 Asymptomatic menopausal state: Secondary | ICD-10-CM

## 2018-08-30 DIAGNOSIS — Z170 Estrogen receptor positive status [ER+]: Principal | ICD-10-CM

## 2018-08-30 DIAGNOSIS — C50919 Malignant neoplasm of unspecified site of unspecified female breast: Secondary | ICD-10-CM

## 2018-08-30 DIAGNOSIS — Z5111 Encounter for antineoplastic chemotherapy: Secondary | ICD-10-CM | POA: Insufficient documentation

## 2018-08-30 DIAGNOSIS — R0602 Shortness of breath: Secondary | ICD-10-CM | POA: Diagnosis not present

## 2018-08-30 DIAGNOSIS — C50011 Malignant neoplasm of nipple and areola, right female breast: Secondary | ICD-10-CM

## 2018-08-30 DIAGNOSIS — C50512 Malignant neoplasm of lower-outer quadrant of left female breast: Secondary | ICD-10-CM

## 2018-08-30 LAB — CBC WITH DIFFERENTIAL (CANCER CENTER ONLY)
Basophils Absolute: 0 10*3/uL (ref 0.0–0.1)
Basophils Relative: 0 %
Eosinophils Absolute: 0.1 10*3/uL (ref 0.0–0.5)
Eosinophils Relative: 2 %
HEMATOCRIT: 33.8 % — AB (ref 34.8–46.6)
HEMOGLOBIN: 11.2 g/dL — AB (ref 11.6–15.9)
LYMPHS ABS: 1.2 10*3/uL (ref 0.9–3.3)
Lymphocytes Relative: 43 %
MCH: 30.8 pg (ref 26.0–34.0)
MCHC: 33.1 g/dL (ref 32.0–36.0)
MCV: 92.9 fL (ref 81.0–101.0)
Monocytes Absolute: 0.1 10*3/uL (ref 0.1–0.9)
Monocytes Relative: 5 %
NEUTROS ABS: 1.3 10*3/uL — AB (ref 1.5–6.5)
NEUTROS PCT: 50 %
Platelet Count: 145 10*3/uL (ref 145–400)
RBC: 3.64 MIL/uL — ABNORMAL LOW (ref 3.70–5.32)
RDW: 14 % (ref 11.1–15.7)
WBC Count: 2.7 10*3/uL — ABNORMAL LOW (ref 3.9–10.0)

## 2018-08-30 LAB — CMP (CANCER CENTER ONLY)
ALT: 27 U/L (ref 10–47)
ANION GAP: 0 — AB (ref 5–15)
AST: 20 U/L (ref 11–38)
Albumin: 3.7 g/dL (ref 3.5–5.0)
Alkaline Phosphatase: 95 U/L — ABNORMAL HIGH (ref 26–84)
BILIRUBIN TOTAL: 0.7 mg/dL (ref 0.2–1.6)
BUN: 11 mg/dL (ref 7–22)
CALCIUM: 9.1 mg/dL (ref 8.0–10.3)
CO2: 29 mmol/L (ref 18–33)
Chloride: 115 mmol/L — ABNORMAL HIGH (ref 98–108)
Creatinine: 1.3 mg/dL — ABNORMAL HIGH (ref 0.60–1.20)
Glucose, Bld: 122 mg/dL — ABNORMAL HIGH (ref 73–118)
Potassium: 4 mmol/L (ref 3.3–4.7)
Sodium: 141 mmol/L (ref 128–145)
TOTAL PROTEIN: 7.4 g/dL (ref 6.4–8.1)

## 2018-08-30 MED ORDER — FULVESTRANT 250 MG/5ML IM SOLN
500.0000 mg | Freq: Once | INTRAMUSCULAR | Status: AC
  Administered 2018-08-30: 500 mg via INTRAMUSCULAR

## 2018-08-30 MED ORDER — FULVESTRANT 250 MG/5ML IM SOLN
INTRAMUSCULAR | Status: AC
  Filled 2018-08-30: qty 10

## 2018-08-30 NOTE — Patient Instructions (Signed)

## 2018-08-30 NOTE — Progress Notes (Signed)
Hematology and Oncology Follow Up Visit  Nancy Herring [id number] 1956/05/28 62 y.o. 08/30/2018   Principle Diagnosis:   Metastatic breast cancer-ER positive/PR positive/HER-2 negative  Current Therapy:    Faslodex/Ibrance - cycle #1 to start on 04/05/2018 -- will start Ibrance on 05/03/2018 --Ibrance held on 08/30/2018  Xgeva - 120 mg SQ q 3 months -- next dose 09/2018     Interim History:  Nancy Herring is back for follow-up.  She is doing okay.  The only complaint that she is short of breath.  She feels this is from the [place].  It certainly could be from the Nancy Herring.  She says she did not have this prior to taking her Ibrance.  I told her to stop the Nancy Herring with this cycle.  She has 1 more week left before she is on the week break.  She has not had her PET scan yet.  I am not sure why the last on was not done.  I will reorder it.  Her CA-27-29 is holding steady at 15.  She is had no masses.  She is had no nausea or vomiting.  There is been no change in bowel or bladder habits.  Overall, her performance status is ECOG 1.    Medications:  Current Outpatient Medications:  .  Biotin 2.5 MG CAPS, Take by mouth every morning., Disp: , Rfl:  .  cholecalciferol (VITAMIN D) 1000 UNITS tablet, Take 2,000 Units by mouth daily. , Disp: , Rfl:  .  Cyanocobalamin (VITAMIN B-12) 1000 MCG SUBL, Place under the tongue daily., Disp: , Rfl:  .  fulvestrant (FASLODEX) 250 MG/5ML injection, Inject 10 mLs (500 mg total) into the muscle every 30 (thirty) days. One injection each buttock over 1-2 minutes. Warm prior to use., Disp: 10 mL, Rfl: 11 .  megestrol (MEGACE) 20 MG tablet, Take 1 tablet (20 mg total) by mouth daily., Disp: 30 tablet, Rfl: 6 .  Multiple Vitamin (MULTI-VITAMIN DAILY PO), Take by mouth every morning., Disp: , Rfl:  .  palbociclib (IBRANCE) 100 MG capsule, Take 1 capsule (100 mg total) by mouth daily with breakfast. Take for 21 days on, then 7 days off. (Patient not taking:  Reported on 02/11/2018), Disp: 21 capsule, Rfl: 4 No current facility-administered medications for this visit.   Facility-Administered Medications Ordered in Other Visits:  .  fulvestrant (FASLODEX) injection 500 mg, 500 mg, Intramuscular, Once, Ennever, Rudell Cobb, MD  Allergies: No Known Allergies  Past Medical History, Surgical history, Social history, and Family History were reviewed and updated.  Review of Systems: Review of Systems  Constitutional: Negative.   HENT:  Negative.   Eyes: Negative.   Respiratory: Negative.   Cardiovascular: Negative.   Gastrointestinal: Negative.   Endocrine: Negative.   Genitourinary: Negative.    Musculoskeletal: Negative.   Skin: Negative.   Neurological: Negative.   Hematological: Negative.   Psychiatric/Behavioral: Negative.     Physical Exam:  weight is 182 lb (82.6 kg). Her oral temperature is 98.2 F (36.8 C). Her blood pressure is 135/73 and her pulse is 71. Her respiration is 16 and oxygen saturation is 100%.   Wt Readings from Last 3 Encounters:  08/30/18 182 lb (82.6 kg)  08/02/18 183 lb (83 kg)  06/28/18 179 lb (81.2 kg)    Physical Exam  Constitutional: She is oriented to person, place, and time.  HENT:  Head: Normocephalic and atraumatic.  Mouth/Throat: Oropharynx is clear and moist.  Eyes: Pupils are equal, round, and reactive to  light. EOM are normal.  Neck: Normal range of motion.  Cardiovascular: Normal rate, regular rhythm and normal heart sounds.  Pulmonary/Chest: Effort normal and breath sounds normal.  Abdominal: Soft. Bowel sounds are normal.  Musculoskeletal: Normal range of motion. She exhibits no edema, tenderness or deformity.  Lymphadenopathy:    She has no cervical adenopathy.  Neurological: She is alert and oriented to person, place, and time.  Skin: Skin is warm and dry. No rash noted. No erythema.  Psychiatric: She has a normal mood and affect. Her behavior is normal. Judgment and thought content  normal.  Vitals reviewed.    Lab Results  Component Value Date   WBC 2.7 (L) 08/30/2018   HGB 11.2 (L) 08/30/2018   HCT 33.8 (L) 08/30/2018   MCV 92.9 08/30/2018   PLT 145 08/30/2018     Chemistry      Component Value Date/Time   NA 141 08/30/2018 1126   NA 142 11/08/2013 1008   K 4.0 08/30/2018 1126   K 3.9 11/08/2013 1008   CL 115 (H) 08/30/2018 1126   CL 103 11/08/2013 1008   CO2 29 08/30/2018 1126   CO2 29 11/08/2013 1008   BUN 11 08/30/2018 1126   BUN 11 11/08/2013 1008   CREATININE 1.30 (H) 08/30/2018 1126   CREATININE 1.4 (H) 11/08/2013 1008      Component Value Date/Time   CALCIUM 9.1 08/30/2018 1126   CALCIUM 9.6 11/08/2013 1008   ALKPHOS 95 (H) 08/30/2018 1126   ALKPHOS 124 (H) 11/08/2013 1008   AST 20 08/30/2018 1126   ALT 27 08/30/2018 1126   ALT 25 11/08/2013 1008   BILITOT 0.7 08/30/2018 1126       Impression and Plan: Nancy Herring is a 62 year old postmenopausal Afro-American female.  She has known metastatic disease.   We will do the PET scan.  We may have to do another breast ultrasound.   I would like to see how the tumors are doing in her left breast.  Again, she will hold her Ibrance.  If she feels better and the breathing is better we will see her back, I may switch her over to ribociclib.   Volanda Napoleon, MD 9/9/20191:15 PM

## 2018-08-31 LAB — CANCER ANTIGEN 27.29: CA 27.29: 21.6 U/mL (ref 0.0–38.6)

## 2018-09-23 ENCOUNTER — Ambulatory Visit (HOSPITAL_COMMUNITY)
Admission: RE | Admit: 2018-09-23 | Discharge: 2018-09-23 | Disposition: A | Discharge status: Home / Self Care | Payer: 59 | Source: Ambulatory Visit | Attending: Hematology & Oncology | Admitting: Hematology & Oncology

## 2018-09-23 DIAGNOSIS — C7951 Secondary malignant neoplasm of bone: Secondary | ICD-10-CM | POA: Diagnosis not present

## 2018-09-23 DIAGNOSIS — C50011 Malignant neoplasm of nipple and areola, right female breast: Secondary | ICD-10-CM

## 2018-09-23 LAB — GLUCOSE, CAPILLARY: Glucose-Capillary: 92 mg/dL (ref 70–99)

## 2018-09-23 MED ORDER — FLUDEOXYGLUCOSE F - 18 (FDG) INJECTION
9.3000 | Freq: Once | INTRAVENOUS | Status: AC | PRN
  Administered 2018-09-23: 9.3 via INTRAVENOUS

## 2018-09-24 ENCOUNTER — Telehealth: Payer: Self-pay | Admitting: *Deleted

## 2018-09-24 NOTE — Telephone Encounter (Addendum)
Patient will be here Monday for results  ----- Message from Volanda Napoleon, MD sent at 09/23/2018  5:31 PM EDT ----- Call - the breast cancer is still responding!!  PET scan looks better!!  Nancy Herring

## 2018-09-27 ENCOUNTER — Ambulatory Visit: Payer: 59

## 2018-09-27 ENCOUNTER — Other Ambulatory Visit: Payer: 59

## 2018-09-27 ENCOUNTER — Ambulatory Visit: Payer: 59 | Admitting: Hematology & Oncology

## 2018-10-21 ENCOUNTER — Inpatient Hospital Stay (HOSPITAL_BASED_OUTPATIENT_CLINIC_OR_DEPARTMENT_OTHER): Payer: 59 | Admitting: Hematology & Oncology

## 2018-10-21 ENCOUNTER — Encounter: Payer: Self-pay | Admitting: Hematology & Oncology

## 2018-10-21 ENCOUNTER — Inpatient Hospital Stay: Payer: 59

## 2018-10-21 ENCOUNTER — Other Ambulatory Visit: Payer: Self-pay

## 2018-10-21 ENCOUNTER — Inpatient Hospital Stay: Payer: 59 | Attending: Hematology & Oncology

## 2018-10-21 VITALS — BP 129/66 | HR 81 | Temp 98.5°F | Resp 18 | Wt 184.0 lb

## 2018-10-21 DIAGNOSIS — Z5111 Encounter for antineoplastic chemotherapy: Secondary | ICD-10-CM | POA: Insufficient documentation

## 2018-10-21 DIAGNOSIS — C7951 Secondary malignant neoplasm of bone: Secondary | ICD-10-CM | POA: Diagnosis not present

## 2018-10-21 DIAGNOSIS — Z780 Asymptomatic menopausal state: Secondary | ICD-10-CM

## 2018-10-21 DIAGNOSIS — C50512 Malignant neoplasm of lower-outer quadrant of left female breast: Secondary | ICD-10-CM | POA: Insufficient documentation

## 2018-10-21 DIAGNOSIS — C50011 Malignant neoplasm of nipple and areola, right female breast: Secondary | ICD-10-CM

## 2018-10-21 DIAGNOSIS — R0602 Shortness of breath: Secondary | ICD-10-CM

## 2018-10-21 DIAGNOSIS — Z170 Estrogen receptor positive status [ER+]: Secondary | ICD-10-CM

## 2018-10-21 DIAGNOSIS — C50919 Malignant neoplasm of unspecified site of unspecified female breast: Secondary | ICD-10-CM

## 2018-10-21 LAB — CMP (CANCER CENTER ONLY)
ALBUMIN: 3.6 g/dL (ref 3.5–5.0)
ALT: 21 U/L (ref 10–47)
ANION GAP: 6 (ref 5–15)
AST: 24 U/L (ref 11–38)
Alkaline Phosphatase: 85 U/L — ABNORMAL HIGH (ref 26–84)
BUN: 10 mg/dL (ref 7–22)
CALCIUM: 9.4 mg/dL (ref 8.0–10.3)
CO2: 28 mmol/L (ref 18–33)
Chloride: 105 mmol/L (ref 98–108)
Creatinine: 1 mg/dL (ref 0.60–1.20)
GLUCOSE: 113 mg/dL (ref 73–118)
Potassium: 4.1 mmol/L (ref 3.3–4.7)
Sodium: 139 mmol/L (ref 128–145)
TOTAL PROTEIN: 7.4 g/dL (ref 6.4–8.1)
Total Bilirubin: 0.5 mg/dL (ref 0.2–1.6)

## 2018-10-21 LAB — CBC WITH DIFFERENTIAL (CANCER CENTER ONLY)
ABS IMMATURE GRANULOCYTES: 0.01 10*3/uL (ref 0.00–0.07)
BASOS PCT: 0 %
Basophils Absolute: 0 10*3/uL (ref 0.0–0.1)
EOS ABS: 0.1 10*3/uL (ref 0.0–0.5)
Eosinophils Relative: 3 %
HEMATOCRIT: 36.5 % (ref 36.0–46.0)
Hemoglobin: 11.6 g/dL — ABNORMAL LOW (ref 12.0–15.0)
IMMATURE GRANULOCYTES: 0 %
LYMPHS ABS: 1.5 10*3/uL (ref 0.7–4.0)
Lymphocytes Relative: 32 %
MCH: 28.4 pg (ref 26.0–34.0)
MCHC: 31.8 g/dL (ref 30.0–36.0)
MCV: 89.5 fL (ref 80.0–100.0)
MONO ABS: 0.4 10*3/uL (ref 0.1–1.0)
MONOS PCT: 8 %
NEUTROS PCT: 57 %
Neutro Abs: 2.6 10*3/uL (ref 1.7–7.7)
Platelet Count: 159 10*3/uL (ref 150–400)
RBC: 4.08 MIL/uL (ref 3.87–5.11)
RDW: 11.8 % (ref 11.5–15.5)
WBC Count: 4.7 10*3/uL (ref 4.0–10.5)
nRBC: 0 % (ref 0.0–0.2)

## 2018-10-21 MED ORDER — DENOSUMAB 120 MG/1.7ML ~~LOC~~ SOLN
SUBCUTANEOUS | Status: AC
  Filled 2018-10-21: qty 1.7

## 2018-10-21 MED ORDER — DENOSUMAB 120 MG/1.7ML ~~LOC~~ SOLN
120.0000 mg | Freq: Once | SUBCUTANEOUS | Status: AC
  Administered 2018-10-21: 120 mg via SUBCUTANEOUS

## 2018-10-21 MED ORDER — FULVESTRANT 250 MG/5ML IM SOLN
INTRAMUSCULAR | Status: AC
  Filled 2018-10-21: qty 10

## 2018-10-21 MED ORDER — FULVESTRANT 250 MG/5ML IM SOLN
500.0000 mg | Freq: Once | INTRAMUSCULAR | Status: AC
  Administered 2018-10-21: 500 mg via INTRAMUSCULAR

## 2018-10-21 NOTE — Patient Instructions (Signed)
Fulvestrant injection What is this medicine? FULVESTRANT (ful VES trant) blocks the effects of estrogen. It is used to treat breast cancer. This medicine may be used for other purposes; ask your health care provider or pharmacist if you have questions. COMMON BRAND NAME(S): FASLODEX What should I tell my health care provider before I take this medicine? They need to know if you have any of these conditions: -bleeding problems -liver disease -low levels of platelets in the blood -an unusual or allergic reaction to fulvestrant, other medicines, foods, dyes, or preservatives -pregnant or trying to get pregnant -breast-feeding How should I use this medicine? This medicine is for injection into a muscle. It is usually given by a health care professional in a hospital or clinic setting. Talk to your pediatrician regarding the use of this medicine in children. Special care may be needed. Overdosage: If you think you have taken too much of this medicine contact a poison control center or emergency room at once. NOTE: This medicine is only for you. Do not share this medicine with others. What if I miss a dose? It is important not to miss your dose. Call your doctor or health care professional if you are unable to keep an appointment. What may interact with this medicine? -medicines that treat or prevent blood clots like warfarin, enoxaparin, and dalteparin This list may not describe all possible interactions. Give your health care provider a list of all the medicines, herbs, non-prescription drugs, or dietary supplements you use. Also tell them if you smoke, drink alcohol, or use illegal drugs. Some items may interact with your medicine. What should I watch for while using this medicine? Your condition will be monitored carefully while you are receiving this medicine. You will need important blood work done while you are taking this medicine. Do not become pregnant while taking this medicine or for  at least 1 year after stopping it. Women of child-bearing potential will need to have a negative pregnancy test before starting this medicine. Women should inform their doctor if they wish to become pregnant or think they might be pregnant. There is a potential for serious side effects to an unborn child. Men should inform their doctors if they wish to father a child. This medicine may lower sperm counts. Talk to your health care professional or pharmacist for more information. Do not breast-feed an infant while taking this medicine or for 1 year after the last dose. What side effects may I notice from receiving this medicine? Side effects that you should report to your doctor or health care professional as soon as possible: -allergic reactions like skin rash, itching or hives, swelling of the face, lips, or tongue -feeling faint or lightheaded, falls -pain, tingling, numbness, or weakness in the legs -signs and symptoms of infection like fever or chills; cough; flu-like symptoms; sore throat -vaginal bleeding Side effects that usually do not require medical attention (report to your doctor or health care professional if they continue or are bothersome): -aches, pains -constipation -diarrhea -headache -hot flashes -nausea, vomiting -pain at site where injected -stomach pain This list may not describe all possible side effects. Call your doctor for medical advice about side effects. You may report side effects to FDA at 1-800-FDA-1088. Where should I keep my medicine? This drug is given in a hospital or clinic and will not be stored at home. NOTE: This sheet is a summary. It may not cover all possible information. If you have questions about this medicine, talk to your   doctor, pharmacist, or health care provider.  2018 Elsevier/Gold Standard (2015-07-06 11:03:55) Denosumab injection What is this medicine? DENOSUMAB (den oh sue mab) slows bone breakdown. Prolia is used to treat osteoporosis in  women after menopause and in men. Xgeva is used to treat a high calcium level due to cancer and to prevent bone fractures and other bone problems caused by multiple myeloma or cancer bone metastases. Xgeva is also used to treat giant cell tumor of the bone. This medicine may be used for other purposes; ask your health care provider or pharmacist if you have questions. COMMON BRAND NAME(S): Prolia, XGEVA What should I tell my health care provider before I take this medicine? They need to know if you have any of these conditions: -dental disease -having surgery or tooth extraction -infection -kidney disease -low levels of calcium or Vitamin D in the blood -malnutrition -on hemodialysis -skin conditions or sensitivity -thyroid or parathyroid disease -an unusual reaction to denosumab, other medicines, foods, dyes, or preservatives -pregnant or trying to get pregnant -breast-feeding How should I use this medicine? This medicine is for injection under the skin. It is given by a health care professional in a hospital or clinic setting. If you are getting Prolia, a special MedGuide will be given to you by the pharmacist with each prescription and refill. Be sure to read this information carefully each time. For Prolia, talk to your pediatrician regarding the use of this medicine in children. Special care may be needed. For Xgeva, talk to your pediatrician regarding the use of this medicine in children. While this drug may be prescribed for children as young as 13 years for selected conditions, precautions do apply. Overdosage: If you think you have taken too much of this medicine contact a poison control center or emergency room at once. NOTE: This medicine is only for you. Do not share this medicine with others. What if I miss a dose? It is important not to miss your dose. Call your doctor or health care professional if you are unable to keep an appointment. What may interact with this  medicine? Do not take this medicine with any of the following medications: -other medicines containing denosumab This medicine may also interact with the following medications: -medicines that lower your chance of fighting infection -steroid medicines like prednisone or cortisone This list may not describe all possible interactions. Give your health care provider a list of all the medicines, herbs, non-prescription drugs, or dietary supplements you use. Also tell them if you smoke, drink alcohol, or use illegal drugs. Some items may interact with your medicine. What should I watch for while using this medicine? Visit your doctor or health care professional for regular checks on your progress. Your doctor or health care professional may order blood tests and other tests to see how you are doing. Call your doctor or health care professional for advice if you get a fever, chills or sore throat, or other symptoms of a cold or flu. Do not treat yourself. This drug may decrease your body's ability to fight infection. Try to avoid being around people who are sick. You should make sure you get enough calcium and vitamin D while you are taking this medicine, unless your doctor tells you not to. Discuss the foods you eat and the vitamins you take with your health care professional. See your dentist regularly. Brush and floss your teeth as directed. Before you have any dental work done, tell your dentist you are receiving this medicine. Do   not become pregnant while taking this medicine or for 5 months after stopping it. Talk with your doctor or health care professional about your birth control options while taking this medicine. Women should inform their doctor if they wish to become pregnant or think they might be pregnant. There is a potential for serious side effects to an unborn child. Talk to your health care professional or pharmacist for more information. What side effects may I notice from receiving this  medicine? Side effects that you should report to your doctor or health care professional as soon as possible: -allergic reactions like skin rash, itching or hives, swelling of the face, lips, or tongue -bone pain -breathing problems -dizziness -jaw pain, especially after dental work -redness, blistering, peeling of the skin -signs and symptoms of infection like fever or chills; cough; sore throat; pain or trouble passing urine -signs of low calcium like fast heartbeat, muscle cramps or muscle pain; pain, tingling, numbness in the hands or feet; seizures -unusual bleeding or bruising -unusually weak or tired Side effects that usually do not require medical attention (report to your doctor or health care professional if they continue or are bothersome): -constipation -diarrhea -headache -joint pain -loss of appetite -muscle pain -runny nose -tiredness -upset stomach This list may not describe all possible side effects. Call your doctor for medical advice about side effects. You may report side effects to FDA at 1-800-FDA-1088. Where should I keep my medicine? This medicine is only given in a clinic, doctor's office, or other health care setting and will not be stored at home. NOTE: This sheet is a summary. It may not cover all possible information. If you have questions about this medicine, talk to your doctor, pharmacist, or health care provider.  2018 Elsevier/Gold Standard (2016-12-30 19:17:21)  

## 2018-10-21 NOTE — Progress Notes (Signed)
Hematology and Oncology Follow Up Visit  Nancy Herring [id number] 12/14/1956 62 y.o. 10/21/2018   Principle Diagnosis:   Metastatic breast cancer-ER positive/PR positive/HER-2 negative  Current Therapy:    Faslodex/Ibrance - cycle #1 to start on 04/05/2018 -- will re-start Ibrance on 10/21/2018  Xgeva - 120 mg SQ q 3 months -- next dose 12/2018     Interim History:  Nancy Herring is back for follow-up.  She is having a little bit of a tough time.  Her brother passed away in [place].  She just got back from the funeral service.  This is been tough on her.  She still has some shortness of breath.  We have stopped her [facility] when we saw her in September thinking that the shortness of breath may have been from the [place].  She says that shortness of breath really is not much better.  As such, I think she needs to see her family doctor.  We did do a PET scan on her.  This was done on 09/23/2018.  Thankfully, the PET scan showed improved breast cancer.  She has metastatic osseous disease.  She has improved hypermetabolism in the bones.  There is nothing new that is being seen.  The left breast lesion looks better with an SUV of 1.2.  Her last CA 27.29 was stable at 21.6.  Her appetite is doing okay.  She has to go back down to [place] for Thanksgiving.  She wants to take some of her grandkids down there.  Overall, I would say performance status is ECOG 1. Medications:  Current Outpatient Medications:  .  Biotin 2.5 MG CAPS, Take by mouth every morning., Disp: , Rfl:  .  cholecalciferol (VITAMIN D) 1000 UNITS tablet, Take 2,000 Units by mouth daily. , Disp: , Rfl:  .  Cyanocobalamin (VITAMIN B-12) 1000 MCG SUBL, Place under the tongue daily., Disp: , Rfl:  .  fulvestrant (FASLODEX) 250 MG/5ML injection, Inject 10 mLs (500 mg total) into the muscle every 30 (thirty) days. One injection each buttock over 1-2 minutes. Warm prior to use., Disp: 10 mL, Rfl: 11 .  megestrol  (MEGACE) 20 MG tablet, Take 1 tablet (20 mg total) by mouth daily., Disp: 30 tablet, Rfl: 6 .  Multiple Vitamin (MULTI-VITAMIN DAILY PO), Take by mouth every morning., Disp: , Rfl:  .  palbociclib (IBRANCE) 100 MG capsule, Take 1 capsule (100 mg total) by mouth daily with breakfast. Take for 21 days on, then 7 days off. (Patient not taking: Reported on 02/11/2018), Disp: 21 capsule, Rfl: 4  Allergies: No Known Allergies  Past Medical History, Surgical history, Social history, and Family History were reviewed and updated.  Review of Systems: Review of Systems  Constitutional: Negative.   HENT:  Negative.   Eyes: Negative.   Respiratory: Negative.   Cardiovascular: Negative.   Gastrointestinal: Negative.   Endocrine: Negative.   Genitourinary: Negative.    Musculoskeletal: Negative.   Skin: Negative.   Neurological: Negative.   Hematological: Negative.   Psychiatric/Behavioral: Negative.     Physical Exam:  weight is 184 lb (83.5 kg). Her oral temperature is 98.5 F (36.9 C). Her blood pressure is 129/66 and her pulse is 81. Her respiration is 18 and oxygen saturation is 100%.   Wt Readings from Last 3 Encounters:  10/21/18 184 lb (83.5 kg)  08/30/18 182 lb (82.6 kg)  08/02/18 183 lb (83 kg)    Physical Exam  Constitutional: She is oriented to person, place, and  time.  HENT:  Head: Normocephalic and atraumatic.  Mouth/Throat: Oropharynx is clear and moist.  Eyes: Pupils are equal, round, and reactive to light. EOM are normal.  Neck: Normal range of motion.  Cardiovascular: Normal rate, regular rhythm and normal heart sounds.  Pulmonary/Chest: Effort normal and breath sounds normal.  Abdominal: Soft. Bowel sounds are normal.  Musculoskeletal: Normal range of motion. She exhibits no edema, tenderness or deformity.  Lymphadenopathy:    She has no cervical adenopathy.  Neurological: She is alert and oriented to person, place, and time.  Skin: Skin is warm and dry. No rash  noted. No erythema.  Psychiatric: She has a normal mood and affect. Her behavior is normal. Judgment and thought content normal.  Vitals reviewed.    Lab Results  Component Value Date   WBC 4.7 10/21/2018   HGB 11.6 (L) 10/21/2018   HCT 36.5 10/21/2018   MCV 89.5 10/21/2018   PLT 159 10/21/2018     Chemistry      Component Value Date/Time   NA 139 10/21/2018 1146   NA 142 11/08/2013 1008   K 4.1 10/21/2018 1146   K 3.9 11/08/2013 1008   CL 105 10/21/2018 1146   CL 103 11/08/2013 1008   CO2 28 10/21/2018 1146   CO2 29 11/08/2013 1008   BUN 10 10/21/2018 1146   BUN 11 11/08/2013 1008   CREATININE 1.00 10/21/2018 1146   CREATININE 1.4 (H) 11/08/2013 1008      Component Value Date/Time   CALCIUM 9.4 10/21/2018 1146   CALCIUM 9.6 11/08/2013 1008   ALKPHOS 85 (H) 10/21/2018 1146   ALKPHOS 124 (H) 11/08/2013 1008   AST 24 10/21/2018 1146   ALT 21 10/21/2018 1146   ALT 25 11/08/2013 1008   BILITOT 0.5 10/21/2018 1146       Impression and Plan: Nancy Herring is a 62 year old postmenopausal Afro-American female.  She has known metastatic disease.   Again, looks like everything is going quite well for Nancy Herring with respect to the breast cancer.  We will get her back on the [place].  I think this is very important for her protocol.  I think the [facility] is part of why she has the improved PET scan.  I will plan to see her back in about 5 weeks.  I will give her an extra week off so that she can go down to [place].  I just feel bad that her brother passed away.  He really had a tough time after having cardiac surgery.  Volanda Napoleon, MD 10/31/20191:17 PM

## 2018-10-22 LAB — CANCER ANTIGEN 27.29: CAN 27.29: 12.6 U/mL (ref 0.0–38.6)

## 2018-11-29 ENCOUNTER — Ambulatory Visit: Payer: 59 | Admitting: Hematology & Oncology

## 2018-11-29 ENCOUNTER — Ambulatory Visit: Payer: 59

## 2018-11-29 ENCOUNTER — Other Ambulatory Visit: Payer: 59

## 2019-06-29 ENCOUNTER — Other Ambulatory Visit: Payer: Self-pay | Admitting: Critical Care Medicine

## 2019-06-29 DIAGNOSIS — Z20822 Contact with and (suspected) exposure to covid-19: Secondary | ICD-10-CM

## 2019-07-04 LAB — NOVEL CORONAVIRUS, NAA: SARS-CoV-2, NAA: NOT DETECTED

## 2019-10-04 ENCOUNTER — Telehealth: Payer: Self-pay

## 2019-10-04 NOTE — Telephone Encounter (Signed)
We can order a colonoscopy.   What is going on with oncology for her breast cancer treatment? She was last seen by Dr. Jonette Eva in October 2019 and has not followed up since

## 2019-10-04 NOTE — Telephone Encounter (Signed)
Copied from [facility] [id number]. Topic: General - Other >> Oct 04, 2019  2:05 PM Celene Kras A wrote: Reason for CRM: Pt called and is needing to schedule a mammogram and a colonoscopy. Please advise.

## 2019-10-05 NOTE — Telephone Encounter (Signed)
Left a message for a return call.

## 2019-10-06 NOTE — Telephone Encounter (Signed)
Left a message for a return call.

## 2019-10-10 NOTE — Telephone Encounter (Signed)
Patient is returning call from [facility]. Please advise.

## 2019-10-12 ENCOUNTER — Encounter: Payer: Self-pay | Admitting: Internal Medicine

## 2019-10-12 ENCOUNTER — Other Ambulatory Visit: Payer: Self-pay | Admitting: Adult Health

## 2019-10-12 DIAGNOSIS — Z1211 Encounter for screening for malignant neoplasm of colon: Secondary | ICD-10-CM

## 2019-10-12 NOTE — Telephone Encounter (Signed)
Spoke to the pt and informed her that we would order colonoscopy.  Please place order.  She is waiting to be scheduled for a 1 year follow up with Dr. Jonette Eva.  Can we order mammo?

## 2019-10-12 NOTE — Telephone Encounter (Signed)
Pt notified that colonoscopy has been ordered.  She will discuss mammo with Dr. Marin Olp.  Nothing further needed.

## 2019-10-12 NOTE — Telephone Encounter (Signed)
I have ordered the colonoscopy. I am going to hold off on ordering mammo as I am not sure which one Dr. Jonette Eva would want

## 2019-10-21 ENCOUNTER — Other Ambulatory Visit: Payer: Self-pay | Admitting: Hematology & Oncology

## 2019-10-21 DIAGNOSIS — Z853 Personal history of malignant neoplasm of breast: Secondary | ICD-10-CM

## 2019-11-15 ENCOUNTER — Encounter: Payer: Self-pay | Admitting: Internal Medicine
